# Patient Record
Sex: Male | Born: 1970 | Race: Black or African American | Hispanic: No | Marital: Married | Smoking: Never smoker
Health system: Southern US, Community
[De-identification: ages and names within clinical notes are randomized; demographics above are authoritative.]

## PROBLEM LIST (undated history)

## (undated) ENCOUNTER — Emergency Department (HOSPITAL_COMMUNITY): Admission: EM | Source: Home / Self Care

## (undated) DIAGNOSIS — K219 Gastro-esophageal reflux disease without esophagitis: Secondary | ICD-10-CM

## (undated) DIAGNOSIS — G4730 Sleep apnea, unspecified: Secondary | ICD-10-CM

## (undated) DIAGNOSIS — R011 Cardiac murmur, unspecified: Secondary | ICD-10-CM

## (undated) HISTORY — PX: OTHER SURGICAL HISTORY: SHX169

---

## 2002-01-03 ENCOUNTER — Encounter: Payer: Self-pay | Admitting: Emergency Medicine

## 2002-01-03 ENCOUNTER — Emergency Department (HOSPITAL_COMMUNITY): Admission: EM | Admit: 2002-01-03 | Discharge: 2002-01-04 | Payer: Self-pay | Admitting: Emergency Medicine

## 2011-05-11 ENCOUNTER — Emergency Department (HOSPITAL_COMMUNITY)
Admission: EM | Admit: 2011-05-11 | Discharge: 2011-05-11 | Disposition: A | Discharge status: Home / Self Care | Payer: Federal, State, Local not specified - PPO | Source: Home / Self Care | Attending: Emergency Medicine | Admitting: Emergency Medicine

## 2011-05-11 ENCOUNTER — Encounter: Payer: Self-pay | Admitting: Emergency Medicine

## 2011-05-11 DIAGNOSIS — R6889 Other general symptoms and signs: Secondary | ICD-10-CM

## 2011-05-11 MED ORDER — OSELTAMIVIR PHOSPHATE 75 MG PO CAPS: 75.0000 mg | ORAL_CAPSULE | Freq: Two times a day (BID) | ORAL | Status: AC

## 2011-05-11 MED ORDER — ACETAMINOPHEN 325 MG PO TABS
650.0000 mg | ORAL_TABLET | Freq: Once | ORAL | Status: AC
  Administered 2011-05-11: 650 mg via ORAL

## 2011-05-11 MED ORDER — ACETAMINOPHEN 325 MG PO TABS
ORAL_TABLET | ORAL | Status: AC
  Filled 2011-05-11: qty 2

## 2011-05-11 MED ORDER — HYDROCOD POLST-CHLORPHEN POLST 10-8 MG/5ML PO LQCR
5.0000 mL | Freq: Two times a day (BID) | ORAL | Status: DC | PRN
Start: 2011-05-11 — End: 2016-11-11

## 2011-05-11 NOTE — ED Notes (Signed)
Cough, congestion.  Onset 2 days WUJ:WJXB sweats at night , frequent urination, change in urinary flow, generalized soreness, slight headache, fever

## 2011-05-11 NOTE — ED Provider Notes (Signed)
History     CSN: [id number] Arrival date & time: 05/11/2011  6:15 PM   First MD Initiated Contact with Patient 05/11/11 1712      Chief Complaint  Patient presents with  . URI    (Consider location/radiation/quality/duration/timing/severity/associated sxs/prior treatment) HPI Comments: Stephen Jensen has had a three-day history of a cough productive of small amounts of clear sputum, chest pain when he coughs, sweats, aches, fever, headache, nasal congestion, rhinorrhea, sneezing, sore throat, and muscle cramps. He has had no definite exposures. He has not had a flu vaccine this year.  Patient is a 40 y.o. male presenting with URI.  URI The primary symptoms include fever, fatigue, headaches, sore throat, cough and myalgias. Primary symptoms do not include ear pain, wheezing, abdominal pain, nausea, vomiting or rash.  The headache is not associated with eye pain or neck stiffness.  Symptoms associated with the illness include chills, congestion and rhinorrhea.    History reviewed. No pertinent past medical history.  History reviewed. No pertinent past surgical history.  History reviewed. No pertinent family history.  History  Substance Use Topics  . Smoking status: Never Smoker   . Smokeless tobacco: Not on file  . Alcohol Use: No      Review of Systems  Constitutional: Positive for fever, chills, diaphoresis and fatigue.  HENT: Positive for congestion, sore throat, rhinorrhea and sneezing. Negative for ear pain, neck stiffness, voice change and postnasal drip.   Eyes: Negative for pain, discharge and redness.  Respiratory: Positive for cough. Negative for chest tightness, shortness of breath and wheezing.   Gastrointestinal: Negative for nausea, vomiting, abdominal pain and diarrhea.  Musculoskeletal: Positive for myalgias.  Skin: Negative for rash.  Neurological: Positive for headaches.    Allergies  Review of patient's allergies indicates no known allergies.  Home  Medications   Current Outpatient Rx  Name Route Sig Dispense Refill  . PSEUDOEPH-CPM-DM-APAP 30-2-15-325 MG PO TABS Oral Take by mouth.      [name] HYDROCOD POLST-CHLORPHEN POLST 10-8 MG/5ML PO LQCR Oral Take 5 mLs by mouth every 12 (twelve) hours as needed. 140 mL 0  . OSELTAMIVIR PHOSPHATE 75 MG PO CAPS Oral Take 1 capsule (75 mg total) by mouth every 12 (twelve) hours. 10 capsule 0    BP 138/66  Pulse 98  Temp(Src) 100.8 F (38.2 C) (Oral)  Resp 16  SpO2 100%  Physical Exam  Nursing note and vitals reviewed. Constitutional: He appears well-developed and well-nourished. No distress.  HENT:  Head: Normocephalic and atraumatic.  Right Ear: External ear normal.  Left Ear: External ear normal.  Nose: Nose normal.  Mouth/Throat: Oropharynx is clear and moist. No oropharyngeal exudate.       Pharynx was erythematous without exudate or ulcerations.  Eyes: Conjunctivae and EOM are normal. Pupils are equal, round, and reactive to light. Right eye exhibits no discharge. Left eye exhibits no discharge.  Neck: Normal range of motion. Neck supple.  Cardiovascular: Normal rate, regular rhythm and normal heart sounds.   Pulmonary/Chest: Effort normal and breath sounds normal. No stridor. No respiratory distress. He has no wheezes. He has no rales. He exhibits no tenderness.  Lymphadenopathy:    He has no cervical adenopathy.  Skin: Skin is warm and dry. No rash noted. He is not diaphoretic.    ED Course  Procedures (including critical care time)  Labs Reviewed - No data to display No results found.   1. Influenza-like illness       MDM  He  has an influenza-like illness. Will treat with Tamiflu and Tussionex.        Stephen Lias, MD 05/11/11 [phone]

## 2014-04-30 ENCOUNTER — Ambulatory Visit (HOSPITAL_COMMUNITY)
Admission: RE | Admit: 2014-04-30 | Discharge: 2014-04-30 | Disposition: A | Discharge status: Home / Self Care | Payer: Federal, State, Local not specified - PPO | Attending: Psychiatry | Admitting: Psychiatry

## 2014-05-01 ENCOUNTER — Encounter (HOSPITAL_COMMUNITY): Payer: Self-pay | Admitting: *Deleted

## 2014-05-01 NOTE — BH Assessment (Signed)
Tele Assessment Note   Stephen Jensen is a 43 y.o. male who [facility], accompanied by his father.  Pt states that he wrote a letter today because he wanted to talk to someone about his issues. Pt states this was only way he could express himself, he says talked with his guidance counselor at school and she was concerned because she had not seen him upset and crying.  Pt.'s counselor felt like pt was endorsing thoughts of self harm.  Pt adamantly denies wanting to harm himself and and father/son contracted for safety, d/c'd with referrals. Discussed disposition with spencer simon, pa , agreed with discharge plan.    Axis I: Depressive Disorder NOS Axis II: Deferred Axis III:  Past Medical History  Diagnosis Date  . Depression    Axis IV: educational problems, other psychosocial or environmental problems, problems related to social environment and problems with primary support group Axis V: 51-60 moderate symptoms  Past Medical History:  Past Medical History  Diagnosis Date  . Depression     No past surgical history on file.  Family History: No family history on file.  Social History:  reports that he has never smoked. He does not have any smokeless tobacco history on file. He reports that he does not drink alcohol or use illicit drugs.  Additional Social History:  Alcohol / Drug Use Pain Medications: None  Prescriptions: None  Over the Counter: None  History of alcohol / drug use?: No history of alcohol / drug abuse Longest period of sobriety (when/how long): Nione   CIWA:   COWS:    PATIENT STRENGTHS: (choose at least two) Ability for insight Communication skills Supportive family/friends  Allergies: No Known Allergies  Home Medications:  (Not in a hospital admission)  OB/GYN Status:  No LMP for male patient.  General Assessment Data Location of Assessment: [facility] Assessment Services Is this a Tele or Face-to-Face Assessment?: Face-to-Face Is this  an Initial Assessment or a Re-assessment for this encounter?: Initial Assessment Living Arrangements: Parent, Other relatives (Lives with parents and younger brother ) Can pt return to current living arrangement?: Yes Admission Status: Voluntary Is patient capable of signing voluntary admission?: No (Pt is a minor ) Transfer from: Home Referral Source: Self/Family/Friend  Medical Screening Exam [facility] Walk-in ONLY) Medical Exam completed: No Reason for MSE not completed: Patient Refused  [facility] Living Arrangements: Parent, Other relatives (Lives with parents and younger brother ) Name of Psychiatrist: None  Name of Therapist: None   Education Status Is patient currently in school?: Yes Current Grade: 12th  Highest grade of school patient has completed: 11th  Name of school: [employer]  Contact person: None   Risk to self with the past 6 months Suicidal Ideation: No Suicidal Intent: No Is patient at risk for suicide?: No Suicidal Plan?: No Access to Means: No What has been your use of drugs/alcohol within the last 12 months?: Pt denies  Previous Attempts/Gestures: No How many times?: 0 Other Self Harm Risks: None  Triggers for Past Attempts: None known Intentional Self Injurious Behavior: None Family Suicide History: No Recent stressful life event(s): Other (Comment), Conflict (Comment) (Issues at home; poor grades at school ) Persecutory voices/beliefs?: No Depression: Yes Depression Symptoms: Guilt, Isolating, Tearfulness, Loss of interest in usual pleasures, Feeling worthless/self pity, Feeling angry/irritable Substance abuse history and/or treatment for substance abuse?: No Suicide prevention information given to non-admitted patients: Not applicable  Risk to Others  within the past 6 months Homicidal Ideation: No Thoughts of Harm to Others: No Current Homicidal Intent: No Current Homicidal Plan: No Access to Homicidal Means:  No Identified Victim: None  History of harm to others?: No Assessment of Violence: None Noted Violent Behavior Description: None  Does patient have access to weapons?: No Criminal Charges Pending?: No Does patient have a court date: No  Psychosis Hallucinations: None noted Delusions: None noted  Mental Status Report Appear/Hygiene: Meticulous Eye Contact: Fair Motor Activity: Unremarkable Speech: Logical/coherent, Soft Level of Consciousness: Alert Mood: Depressed, Sad Affect: Depressed, Flat, Sad Anxiety Level: None Thought Processes: Coherent, Relevant Judgement: Impaired Orientation: Person, Place, Time, Situation Obsessive Compulsive Thoughts/Behaviors: None  Cognitive Functioning Concentration: Decreased Memory: Recent Intact, Remote Intact IQ: Average Insight: Fair Impulse Control: Good Appetite: Good Weight Loss: 0 Weight Gain: 0 Sleep: No Change Total Hours of Sleep: 6 Vegetative Symptoms: None  ADLScreening [facility] Assessment Services) Patient's cognitive ability adequate to safely complete daily activities?: Yes Patient able to express need for assistance with ADLs?: Yes Independently performs ADLs?: Yes (appropriate for developmental age)  Prior Inpatient Therapy Prior Inpatient Therapy: No Prior Therapy Dates: None  Prior Therapy Facilty/Provider(s): None  Reason for Treatment: None   Prior Outpatient Therapy Prior Outpatient Therapy: No Prior Therapy Dates: None  Prior Therapy Facilty/Provider(s): None  Reason for Treatment: None   ADL Screening (condition at time of admission) Patient's cognitive ability adequate to safely complete daily activities?: Yes Is the patient deaf or have difficulty hearing?: No Does the patient have difficulty seeing, even when wearing glasses/contacts?: No Does the patient have difficulty concentrating, remembering, or making decisions?: Yes Patient able to express need for assistance with ADLs?: Yes Does the  patient have difficulty dressing or bathing?: No Independently performs ADLs?: Yes (appropriate for developmental age) Does the patient have difficulty walking or climbing stairs?: No Weakness of Legs: None Weakness of Arms/Hands: None  Home Assistive Devices/Equipment Home Assistive Devices/Equipment: None  Therapy Consults (therapy consults require a physician order) PT Evaluation Needed: No OT Evalulation Needed: No SLP Evaluation Needed: No Abuse/Neglect Assessment (Assessment to be complete while patient is alone) Physical Abuse: Denies Verbal Abuse: Denies Sexual Abuse: Denies Exploitation of patient/patient's resources: Denies Self-Neglect: Denies Values / Beliefs Cultural Requests During Hospitalization: None Spiritual Requests During Hospitalization: None Consults Spiritual Care Consult Needed: No Social Work Consult Needed: No Merchant navy officerAdvance Directives (For Healthcare) Does patient have an advance directive?: No Would patient like information on creating an advanced directive?: No - patient declined information Nutrition Screen- [facility] Adult/[facility] Patient's home diet: Regular  Additional Information 1:1 In Past 12 Months?: No CIRT Risk: No Elopement Risk: No Does patient have medical clearance?: No  Child/Adolescent Assessment Running Away Risk: Denies Bed-Wetting: Denies Destruction of Property: Denies Cruelty to Animals: Denies Stealing: Denies Rebellious/Defies Authority: Denies Dispensing opticianatanic Involvement: Denies Archivistire Setting: Denies Problems at [employer]: Admits Problems at [employer] as Evidenced By: Failing classes  Gang Involvement: Denies  Disposition:  Disposition Initial Assessment Completed for this Encounter: Yes Disposition of Patient: Outpatient treatment, Referred to [facility](Spencer Simon, PA recommend outpt svcs with referrals ) Type of outpatient treatment: Child / Adolescent Patient referred to: Outpatient clinic referral [facility](Spencer Simon, [place] recommend outpt svcs  )  Murrell ReddenSimmons, Finleigh Cheong C 05/01/2014 12:37 AM

## 2016-11-11 ENCOUNTER — Emergency Department (HOSPITAL_COMMUNITY)
Admission: EM | Admit: 2016-11-11 | Discharge: 2016-11-11 | Disposition: A | Discharge status: Home / Self Care | Payer: Federal, State, Local not specified - PPO | Attending: Emergency Medicine | Admitting: Emergency Medicine

## 2016-11-11 ENCOUNTER — Emergency Department (HOSPITAL_COMMUNITY): Payer: Federal, State, Local not specified - PPO

## 2016-11-11 ENCOUNTER — Encounter (HOSPITAL_COMMUNITY): Payer: Self-pay

## 2016-11-11 DIAGNOSIS — Y929 Unspecified place or not applicable: Secondary | ICD-10-CM | POA: Diagnosis not present

## 2016-11-11 DIAGNOSIS — Z79899 Other long term (current) drug therapy: Secondary | ICD-10-CM | POA: Diagnosis not present

## 2016-11-11 DIAGNOSIS — X58XXXA Exposure to other specified factors, initial encounter: Secondary | ICD-10-CM | POA: Insufficient documentation

## 2016-11-11 DIAGNOSIS — Y999 Unspecified external cause status: Secondary | ICD-10-CM | POA: Diagnosis not present

## 2016-11-11 DIAGNOSIS — Y9367 Activity, basketball: Secondary | ICD-10-CM | POA: Insufficient documentation

## 2016-11-11 DIAGNOSIS — M25562 Pain in left knee: Secondary | ICD-10-CM | POA: Diagnosis not present

## 2016-11-11 DIAGNOSIS — S76112A Strain of left quadriceps muscle, fascia and tendon, initial encounter: Secondary | ICD-10-CM

## 2016-11-11 DIAGNOSIS — S8992XA Unspecified injury of left lower leg, initial encounter: Secondary | ICD-10-CM | POA: Diagnosis not present

## 2016-11-11 DIAGNOSIS — S8991XA Unspecified injury of right lower leg, initial encounter: Secondary | ICD-10-CM | POA: Diagnosis present

## 2016-11-11 MED ORDER — HYDROCODONE-ACETAMINOPHEN 5-325 MG PO TABS: 1.0000 | ORAL_TABLET | Freq: Four times a day (QID) | ORAL | 0 refills | Status: AC | PRN

## 2016-11-11 MED ORDER — IBUPROFEN 400 MG PO TABS: 400.0000 mg | ORAL_TABLET | Freq: Three times a day (TID) | ORAL | 0 refills | Status: AC

## 2016-11-11 NOTE — ED Provider Notes (Addendum)
[facility]-EMERGENCY DEPT Provider Note   CSN: [id number] Arrival date & time: 11/11/16  2057     History   Chief Complaint Chief Complaint  Patient presents with  . Knee Injury    HPI Stephen Jensen is a 46 y.o. male.  HPI  Patient presents with acute onset left knee pain, inability to extend the leg. Patient was playing basketball, jumped, felt sudden onset of pain. He did not fall, has no other complaints, but since the event has been unable to extend the leg. The pain is sore, severe, focally about the knee, distal anterior thigh.  Past Medical History:  Diagnosis Date  . Depression     There are no active problems to display for this patient.   History reviewed. No pertinent surgical history.     Home Medications    Prior to Admission medications   Medication Sig Start Date End Date Taking? Authorizing Provider  HYDROcodone-acetaminophen (NORCO/VICODIN) 5-325 MG tablet Take 1 tablet by mouth every 6 (six) hours as needed for severe pain. 11/11/16   Gerhard MunchLockwood, Davelle Anselmi, MD  ibuprofen (ADVIL,MOTRIN) 400 MG tablet Take 1 tablet (400 mg total) by mouth 3 (three) times daily. Take one tablet three times daily for three days 11/11/16 11/14/16  Gerhard MunchLockwood, Lowen Barringer, MD    Family History History reviewed. No pertinent family history.  Social History Social History  Substance Use Topics  . Smoking status: Never Smoker  . Smokeless tobacco: Not on file  . Alcohol use No     Allergies   Patient has no known allergies.   Review of Systems Review of Systems  Constitutional: Negative.   Cardiovascular: Negative for palpitations.  Gastrointestinal: Negative for nausea.  Musculoskeletal:       Negative aside from HPI  Skin:       Negative aside from HPI  Allergic/Immunologic: Negative for immunocompromised state.  Neurological: Negative for weakness.  Hematological: Negative.      Physical Exam Updated Vital Signs BP (!) 152/90 (BP Location: Right Arm)    Pulse (!) 111   Temp 98.3 F (36.8 C)   Resp 18   Ht 5\' 11"  (1.803 m)   SpO2 99%   Physical Exam  Constitutional: He is oriented to person, place, and time. He appears well-developed. No distress.  HENT:  Head: Normocephalic and atraumatic.  Eyes: Conjunctivae and EOM are normal.  Cardiovascular: Normal rate, regular rhythm and intact distal pulses.   Pulmonary/Chest: Effort normal. No stridor. No respiratory distress.  Abdominal: He exhibits no distension.  Musculoskeletal: He exhibits no edema.       Left hip: Normal.       Right knee: Normal.       Left knee: He exhibits decreased range of motion, swelling and deformity.       Right ankle: Normal.       Left ankle: Normal.       Legs: Neurological: He is alert and oriented to person, place, and time.  Skin: Skin is warm and dry.  Psychiatric: He has a normal mood and affect.  Nursing note and vitals reviewed.    ED Treatments / Results   Radiology Dg Knee Complete 4 Views Left  Result Date: 11/11/2016 CLINICAL DATA:  Left knee pain after injury. EXAM: LEFT KNEE - COMPLETE 4+ VIEW COMPARISON:  None. FINDINGS: There is patellar Oda KiltsAlta. Prepatellar soft tissue edema anteriorly with indistinctness of the patellar tendon contours. Small joint effusion. No acute fracture. Tibiofemoral alignment is maintained. IMPRESSION: Patellar Alta with  prepatellar soft tissue edema and indistinct patellar tendon contour suspicious for patellar tendon rupture/ injury. No acute fracture. Electronically Signed   By: Rubye Oaks M.D.   On: 11/11/2016 21:36    Procedures Procedures (including critical care time)  After my initial evaluation the patient was placed in a knee immobilization device by orthopedic technician. This was well tolerated. Subsequently I discussed patient's case with our orthopedist on call, who will see the patient in the office tomorrow.   Initial Impression / Assessment and Plan / ED Course  I have reviewed the  triage vital signs and the nursing notes.  Pertinent labs & imaging results that were available during my care of the patient were reviewed by me and considered in my medical decision making (see chart for details).    Final Clinical Impressions(s) / ED Diagnoses   Final diagnoses:  Rupture of left quadriceps tendon, initial encounter    New Prescriptions New Prescriptions   HYDROCODONE-ACETAMINOPHEN (NORCO/VICODIN) 5-325 MG TABLET    Take 1 tablet by mouth every 6 (six) hours as needed for severe pain.   IBUPROFEN (ADVIL,MOTRIN) 400 MG TABLET    Take 1 tablet (400 mg total) by mouth 3 (three) times daily. Take one tablet three times daily for three days     Gerhard Munch, MD 11/11/16 2247    Gerhard Munch, MD 11/19/16 [id number]

## 2016-11-11 NOTE — ED Triage Notes (Signed)
Pt dislocated L patella today while playing basketball. No hx. A&Ox4. Patella feels like it is superior to the normal position.

## 2016-11-11 NOTE — Discharge Instructions (Signed)
As discussed, with your quadriceps tendon disruption and is important that you follow-up with our orthopedic physician tomorrow. Please keep your leg in the immobilization device, and monitor your condition carefully. If you develop new, or concerning changes, return here.

## 2016-11-12 DIAGNOSIS — S86812A Strain of other muscle(s) and tendon(s) at lower leg level, left leg, initial encounter: Secondary | ICD-10-CM | POA: Diagnosis not present

## 2016-11-15 ENCOUNTER — Ambulatory Visit: Payer: Self-pay | Admitting: Specialist

## 2016-11-15 NOTE — Patient Instructions (Signed)
Stephen Jensen  11/15/2016   Your procedure is scheduled on: 11/17/16  [facility]  [place] [place] to 3rd floor to  Short Stay Center at     0800 AM.    Call this number if you have problems the morning of surgery [phone]   Remember: ONLY 1 PERSON MAY GO WITH YOU TO SHORT STAY TO GET  READY MORNING OF YOUR SURGERY.  Do not eat food or drink liquids :After Midnight.     Take these medicines the morning of surgery with A SIP OF WATER: hydrocodone if needed                                You may not have any metal on your body including hair pins and              piercings  Do not wear jewelry, lotions, powders or perfumes, deodorant                      Men may shave face and neck.   Do not bring valuables to the hospital. [place] IS NOT             RESPONSIBLE   FOR VALUABLES.  Contacts, dentures or bridgework may not be worn into surgery.  Leave suitcase in the car. After surgery it may be brought to your room.                Please read over the following fact sheets you were given: _____________________________________________________________________            [facility] Pre[facility] Before surgery, you can play an important role.  Because skin is not sterile, your skin needs to be as free of germs as possible.  You can reduce the number of germs on your skin by washing with CHG (chlorahexidine gluconate) soap before surgery.  CHG is an antiseptic cleaner which kills germs and bonds with the skin to continue killing germs even after washing. Please DO NOT use if you have an allergy to CHG or antibacterial soaps.  If your skin becomes reddened/irritated stop using the CHG and inform your nurse when you arrive at Short Stay. Do not shave (including legs and underarms) for at least 48 hours prior to the first CHG shower.  You may shave your face/neck. Please follow these instructions carefully:  1.   Shower with CHG Soap the night before surgery and the  morning of Surgery.  2.  If you choose to wash your hair, wash your hair first as usual with your  normal  shampoo.  3.  After you shampoo, rinse your hair and body thoroughly to remove the  shampoo.                           4.  Use CHG as you would any other liquid soap.  You can apply chg directly  to the skin and wash                       Gently with a scrungie or clean washcloth.  5.  Apply the CHG Soap to your body ONLY FROM THE NECK DOWN.   Do not use on face/  open                           Wound or open sores. Avoid contact with eyes, ears mouth and genitals (private parts).                       Wash face,  Genitals (private parts) with your normal soap.             6.  Wash thoroughly, paying special attention to the area where your surgery  will be performed.  7.  Thoroughly rinse your body with warm water from the neck down.  8.  DO NOT shower/wash with your normal soap after using and rinsing off  the CHG Soap.                9.  Pat yourself dry with a clean towel.            10.  Wear clean pajamas.            11.  Place clean sheets on your bed the night of your first shower and do not  sleep with pets. Day of Surgery : Do not apply any lotions/deodorants the morning of surgery.  Please wear clean clothes to the hospital/surgery center.  FAILURE TO FOLLOW THESE INSTRUCTIONS MAY RESULT IN THE CANCELLATION OF YOUR SURGERY PATIENT SIGNATURE_________________________________  NURSE SIGNATURE__________________________________  ________________________________________________________________________   Stephen Jensen  An incentive spirometer is a tool that can help keep your lungs clear and active. This tool measures how well you are filling your lungs with each breath. Taking long deep breaths may help reverse or decrease the chance of developing breathing (pulmonary) problems (especially infection) following:  A long  period of time when you are unable to move or be active. BEFORE THE PROCEDURE   If the spirometer includes an indicator to show your best effort, your nurse or respiratory therapist will set it to a desired goal.  If possible, sit up straight or lean slightly forward. Try not to slouch.  Hold the incentive spirometer in an upright position. INSTRUCTIONS FOR USE  1. Sit on the edge of your bed if possible, or sit up as far as you can in bed or on a chair. 2. Hold the incentive spirometer in an upright position. 3. Breathe out normally. 4. Place the mouthpiece in your mouth and seal your lips tightly around it. 5. Breathe in slowly and as deeply as possible, raising the piston or the ball toward the top of the column. 6. Hold your breath for 3-5 seconds or for as long as possible. Allow the piston or ball to fall to the bottom of the column. 7. Remove the mouthpiece from your mouth and breathe out normally. 8. Rest for a few seconds and repeat Steps 1 through 7 at least 10 times every 1-2 hours when you are awake. Take your time and take a few normal breaths between deep breaths. 9. The spirometer may include an indicator to show your best effort. Use the indicator as a goal to work toward during each repetition. 10. After each set of 10 deep breaths, practice coughing to be sure your lungs are clear. If you have an incision (the cut made at the time of surgery), support your incision when coughing by placing a pillow or rolled up towels firmly against it. Once you are able to get out of bed, walk around indoors and  cough well. You may stop using the incentive spirometer when instructed by your caregiver.  RISKS AND COMPLICATIONS  Take your time so you do not get dizzy or light-headed.  If you are in pain, you may need to take or ask for pain medication before doing incentive spirometry. It is harder to take a deep breath if you are having pain. AFTER USE  Rest and breathe slowly and  easily.  It can be helpful to keep track of a log of your progress. Your caregiver can provide you with a simple table to help with this. If you are using the spirometer at home, follow these instructions: [place] IF:   You are having difficultly using the spirometer.  You have trouble using the spirometer as often as instructed.  Your pain medication is not giving enough relief while using the spirometer.  You develop fever of 100.5 F (38.1 C) or higher. SEEK IMMEDIATE MEDICAL CARE IF:   You cough up bloody sputum that had not been present before.  You develop fever of 102 F (38.9 C) or greater.  You develop worsening pain at or near the incision site. MAKE SURE YOU:   Understand these instructions.  Will watch your condition.  Will get help right away if you are not doing well or get worse. Document Released: 09/20/2006 Document Revised: 08/02/2011 Document Reviewed: 11/21/2006 [facility] Patient Information 2014 [place], [place].   ________________________________________________________________________

## 2016-11-16 ENCOUNTER — Encounter (HOSPITAL_COMMUNITY)
Admission: RE | Admit: 2016-11-16 | Discharge: 2016-11-16 | Disposition: A | Discharge status: Home / Self Care | Payer: Federal, State, Local not specified - PPO | Source: Ambulatory Visit | Attending: Specialist | Admitting: Specialist

## 2016-11-16 ENCOUNTER — Encounter (HOSPITAL_COMMUNITY): Payer: Self-pay

## 2016-11-16 DIAGNOSIS — Z6830 Body mass index (BMI) 30.0-30.9, adult: Secondary | ICD-10-CM | POA: Diagnosis not present

## 2016-11-16 DIAGNOSIS — Y9367 Activity, basketball: Secondary | ICD-10-CM | POA: Diagnosis not present

## 2016-11-16 DIAGNOSIS — S76112A Strain of left quadriceps muscle, fascia and tendon, initial encounter: Secondary | ICD-10-CM | POA: Diagnosis not present

## 2016-11-16 HISTORY — DX: Sleep apnea, unspecified: G47.30

## 2016-11-16 HISTORY — DX: Gastro-esophageal reflux disease without esophagitis: K21.9

## 2016-11-16 HISTORY — DX: Cardiac murmur, unspecified: R01.1

## 2016-11-16 LAB — CBC
HEMATOCRIT: 45.1 % (ref 39.0–52.0)
Hemoglobin: 15.8 g/dL (ref 13.0–17.0)
MCH: 30.2 pg (ref 26.0–34.0)
MCHC: 35 g/dL (ref 30.0–36.0)
MCV: 86.1 fL (ref 78.0–100.0)
PLATELETS: 268 10*3/uL (ref 150–400)
RBC: 5.24 MIL/uL (ref 4.22–5.81)
RDW: 13.4 % (ref 11.5–15.5)
WBC: 5.9 10*3/uL (ref 4.0–10.5)

## 2016-11-16 NOTE — H&P (Signed)
Stephen Jensen is an 46 y.o. male.   Chief Complaint: left knee pain HPI:  Patient notes left knee swelling and pain.   This is a very pleasant gentleman, who works for the [employer] in [employer]. He was playing basketball yesterday, was coming down from border jump shot and had pain in his knee, acute swelling and inability to extend. He was seen at the emergency room at [facility]. X-rays demonstrate a high riding patella consistent with a patellar tendon or patellar ligament tear. He reported no problems prior to that. No locking, popping, or giving way.  He was given ibuprofen.  He denies numbness or tingling.   MRI findings show a rupture of the patellar ligament. In addition, there is some stripping of the quadriceps tendon in the anterior portion of it. Cartilage flap of the patella, but no ACL or PCL. Stephen Jensen  Patient has failed conservative treatment.  It is felt at this time they would benefit from surgical intervention.  Risks and benefits of surgery discussed with patient and they wish to proceed. Past Medical History:  Diagnosis Date  . GERD (gastroesophageal reflux disease)    occasional  . Heart murmur    hx of  . Sleep apnea    Does not wear cpap    Past Surgical History:  Procedure Laterality Date  . nickel removed     was swallowed    No family history on file. Social History:  reports that he has never smoked. He has never used smokeless tobacco. He reports that he does not drink alcohol or use drugs.  Allergies: No Known Allergies  No prescriptions prior to admission.    Results for orders placed or performed during the hospital encounter of 11/16/16 (from the past 48 hour(s))  CBC     Status: None   Collection Time: 11/16/16 10:42 AM  Result Value Ref Range   WBC 5.9 4.0 - 10.5 K/uL   RBC 5.24 4.22 - 5.81 MIL/uL   Hemoglobin 15.8 13.0 - 17.0 g/dL   HCT 16.1 09.6 - 04.5 %   MCV 86.1 78.0 - 100.0 fL   MCH 30.2 26.0 - 34.0 pg   MCHC 35.0 30.0 -  36.0 g/dL   RDW 40.9 81.1 - 91.4 %   Platelets 268 150 - 400 K/uL   No results found.  Review of Systems: The patient denies any fever, chills, night sweats, or bleeding tendencies. CNS: No blurred double vision, seizure, headache, paralysis. RESPIRATORY: No shortness of breath, productive cough, or hemoptysis. CARDIOVASCULAR: No chest pain, angina, or orthopnea, GU: No dysuria, hematuria, or discharge. MUSCULOSKELETAL: Pertinent per HPI.  There were no vitals taken for this visit.  GENERAL: Patient is a 46 y.o. male, well-nourished, well-developed, no acute distress. Alert, oriented, cooperative. HENT:  Normocephalic, atraumatic. Pupils round and reactive. EOMs intact. NECK:  Supple, no bruits. CHEST:  Clear to anterior and posterior chest walls. No rhonchi, rales, wheezes. HEART:  Regular, rate and rhythm.  No murmurs.  S1 and S2 noted. ABDOMEN:  Soft, nontender, bowel sounds present. RECTAL/BREAST/GENITALIA:  Not done, not pertinent to present illness. EXTREMITIES:On exam, left knee has a moderate effusion. Unable to perform active straight leg raise. Mildly tender over the medial collateral ligament. Nontender laterally. No evidence of DVT. He is neurovascularly intact.  Ipsilateral hip and ankle exams unremarkable.   Assessment/Plan Traumatic rupture of left patella We discussed patellar ligament repair in hospital. We will initiate that as soon as possible. Get down  some of the swelling prior to that rest, ice, elevation.  Stephen Jensen 11/16/2016, 3:57 PM

## 2016-11-17 ENCOUNTER — Ambulatory Visit (HOSPITAL_COMMUNITY): Payer: Federal, State, Local not specified - PPO | Admitting: Certified Registered Nurse Anesthetist

## 2016-11-17 ENCOUNTER — Encounter (HOSPITAL_COMMUNITY)
Admission: RE | Disposition: A | Discharge status: Home / Self Care | Payer: Self-pay | Source: Ambulatory Visit | Attending: Specialist

## 2016-11-17 ENCOUNTER — Ambulatory Visit (HOSPITAL_COMMUNITY)
Admission: RE | Admit: 2016-11-17 | Discharge: 2016-11-18 | Disposition: A | Discharge status: Home / Self Care | Payer: Federal, State, Local not specified - PPO | Source: Ambulatory Visit | Attending: Specialist | Admitting: Specialist

## 2016-11-17 ENCOUNTER — Encounter (HOSPITAL_COMMUNITY): Payer: Self-pay | Admitting: *Deleted

## 2016-11-17 DIAGNOSIS — Z6830 Body mass index (BMI) 30.0-30.9, adult: Secondary | ICD-10-CM | POA: Insufficient documentation

## 2016-11-17 DIAGNOSIS — S86812A Strain of other muscle(s) and tendon(s) at lower leg level, left leg, initial encounter: Secondary | ICD-10-CM | POA: Diagnosis not present

## 2016-11-17 DIAGNOSIS — Y9367 Activity, basketball: Secondary | ICD-10-CM | POA: Insufficient documentation

## 2016-11-17 DIAGNOSIS — G8918 Other acute postprocedural pain: Secondary | ICD-10-CM | POA: Diagnosis not present

## 2016-11-17 DIAGNOSIS — S76112A Strain of left quadriceps muscle, fascia and tendon, initial encounter: Secondary | ICD-10-CM | POA: Insufficient documentation

## 2016-11-17 DIAGNOSIS — S86819A Strain of other muscle(s) and tendon(s) at lower leg level, unspecified leg, initial encounter: Secondary | ICD-10-CM | POA: Diagnosis present

## 2016-11-17 HISTORY — PX: QUADRICEPS TENDON REPAIR: SHX756

## 2016-11-17 HISTORY — PX: REPAIR OF RUPTURED PATELLA LIGAMENT: SHX6066

## 2016-11-17 SURGERY — REPAIR, TENDON, QUADRICEPS
Anesthesia: General | Site: Knee | Laterality: Left

## 2016-11-17 MED ORDER — POLYETHYLENE GLYCOL 3350 17 G PO PACK: 17.0000 g | PACK | Freq: Every day | ORAL | Status: DC | PRN

## 2016-11-17 MED ORDER — MEPERIDINE HCL 50 MG/ML IJ SOLN: 6.2500 mg | INTRAMUSCULAR | Status: DC | PRN

## 2016-11-17 MED ORDER — FENTANYL CITRATE (PF) 250 MCG/5ML IJ SOLN
INTRAMUSCULAR | Status: AC
  Filled 2016-11-17: qty 5

## 2016-11-17 MED ORDER — SODIUM CHLORIDE 0.9 % IV SOLN
INTRAVENOUS | Status: AC
  Filled 2016-11-17: qty 500000

## 2016-11-17 MED ORDER — ROCURONIUM BROMIDE 50 MG/5ML IV SOSY
PREFILLED_SYRINGE | INTRAVENOUS | Status: AC
  Filled 2016-11-17: qty 5

## 2016-11-17 MED ORDER — MIDAZOLAM HCL 5 MG/5ML IJ SOLN
INTRAMUSCULAR | Status: DC | PRN
  Administered 2016-11-17: 2 mg via INTRAVENOUS

## 2016-11-17 MED ORDER — CHLORHEXIDINE GLUCONATE 4 % EX LIQD: 60.0000 mL | Freq: Once | CUTANEOUS | Status: DC

## 2016-11-17 MED ORDER — BUPIVACAINE-EPINEPHRINE (PF) 0.5% -1:200000 IJ SOLN
INTRAMUSCULAR | Status: AC
  Filled 2016-11-17: qty 30

## 2016-11-17 MED ORDER — KETOROLAC TROMETHAMINE 15 MG/ML IJ SOLN
15.0000 mg | Freq: Four times a day (QID) | INTRAMUSCULAR | Status: AC
  Administered 2016-11-17 – 2016-11-18 (×4): 15 mg via INTRAVENOUS
  Filled 2016-11-17 (×4): qty 1

## 2016-11-17 MED ORDER — ONDANSETRON HCL 4 MG/2ML IJ SOLN
INTRAMUSCULAR | Status: AC
  Filled 2016-11-17: qty 2

## 2016-11-17 MED ORDER — BUPIVACAINE-EPINEPHRINE 0.5% -1:200000 IJ SOLN
INTRAMUSCULAR | Status: DC | PRN
  Administered 2016-11-17: 20 mL

## 2016-11-17 MED ORDER — CEFAZOLIN SODIUM-DEXTROSE 2-4 GM/100ML-% IV SOLN
2.0000 g | INTRAVENOUS | Status: AC
  Administered 2016-11-17: 2 g via INTRAVENOUS
  Filled 2016-11-17: qty 100

## 2016-11-17 MED ORDER — KETOROLAC TROMETHAMINE 30 MG/ML IJ SOLN
INTRAMUSCULAR | Status: DC | PRN
  Administered 2016-11-17: 30 mg via INTRAVENOUS

## 2016-11-17 MED ORDER — MIDAZOLAM HCL 2 MG/2ML IJ SOLN: 0.5000 mg | Freq: Once | INTRAMUSCULAR | Status: DC | PRN

## 2016-11-17 MED ORDER — DOCUSATE SODIUM 100 MG PO CAPS
100.0000 mg | ORAL_CAPSULE | Freq: Two times a day (BID) | ORAL | Status: DC
  Administered 2016-11-17 – 2016-11-18 (×2): 100 mg via ORAL
  Filled 2016-11-17 (×2): qty 1

## 2016-11-17 MED ORDER — FENTANYL CITRATE (PF) 100 MCG/2ML IJ SOLN
INTRAMUSCULAR | Status: AC
  Filled 2016-11-17: qty 2

## 2016-11-17 MED ORDER — METOCLOPRAMIDE HCL 5 MG PO TABS: 5.0000 mg | ORAL_TABLET | Freq: Three times a day (TID) | ORAL | Status: DC | PRN

## 2016-11-17 MED ORDER — MIDAZOLAM HCL 2 MG/2ML IJ SOLN
INTRAMUSCULAR | Status: AC
  Filled 2016-11-17: qty 2

## 2016-11-17 MED ORDER — ACETAMINOPHEN 325 MG PO TABS
650.0000 mg | ORAL_TABLET | Freq: Four times a day (QID) | ORAL | Status: DC | PRN
  Administered 2016-11-17: 17:00:00 650 mg via ORAL
  Filled 2016-11-17: qty 2

## 2016-11-17 MED ORDER — FENTANYL CITRATE (PF) 100 MCG/2ML IJ SOLN
INTRAMUSCULAR | Status: DC | PRN
  Administered 2016-11-17: 100 ug via INTRAVENOUS
  Administered 2016-11-17 (×5): 50 ug via INTRAVENOUS

## 2016-11-17 MED ORDER — ONDANSETRON HCL 4 MG/2ML IJ SOLN: 4.0000 mg | Freq: Four times a day (QID) | INTRAMUSCULAR | Status: DC | PRN

## 2016-11-17 MED ORDER — LACTATED RINGERS IV SOLN
INTRAVENOUS | Status: DC
  Administered 2016-11-17 (×2): via INTRAVENOUS

## 2016-11-17 MED ORDER — KETOROLAC TROMETHAMINE 30 MG/ML IJ SOLN
INTRAMUSCULAR | Status: AC
  Filled 2016-11-17: qty 1

## 2016-11-17 MED ORDER — BUPIVACAINE-EPINEPHRINE (PF) 0.5% -1:200000 IJ SOLN
INTRAMUSCULAR | Status: DC | PRN
  Administered 2016-11-17: 30 mL via PERINEURAL

## 2016-11-17 MED ORDER — ONDANSETRON HCL 4 MG/2ML IJ SOLN
INTRAMUSCULAR | Status: DC | PRN
  Administered 2016-11-17: 4 mg via INTRAVENOUS

## 2016-11-17 MED ORDER — ACETAMINOPHEN 650 MG RE SUPP: 650.0000 mg | Freq: Four times a day (QID) | RECTAL | Status: DC | PRN

## 2016-11-17 MED ORDER — METOCLOPRAMIDE HCL 5 MG/ML IJ SOLN: 5.0000 mg | Freq: Three times a day (TID) | INTRAMUSCULAR | Status: DC | PRN

## 2016-11-17 MED ORDER — DOCUSATE SODIUM 100 MG PO CAPS: 100.0000 mg | ORAL_CAPSULE | Freq: Two times a day (BID) | ORAL | 1 refills | Status: AC | PRN

## 2016-11-17 MED ORDER — DEXAMETHASONE SODIUM PHOSPHATE 10 MG/ML IJ SOLN
INTRAMUSCULAR | Status: AC
  Filled 2016-11-17: qty 1

## 2016-11-17 MED ORDER — LIDOCAINE 2% (20 MG/ML) 5 ML SYRINGE
INTRAMUSCULAR | Status: AC
  Filled 2016-11-17: qty 5

## 2016-11-17 MED ORDER — OXYCODONE-ACETAMINOPHEN 5-325 MG PO TABS: 1.0000 | ORAL_TABLET | ORAL | 0 refills | Status: AC | PRN

## 2016-11-17 MED ORDER — PROPOFOL 10 MG/ML IV BOLUS
INTRAVENOUS | Status: AC
  Filled 2016-11-17: qty 20

## 2016-11-17 MED ORDER — PROPOFOL 10 MG/ML IV BOLUS
INTRAVENOUS | Status: DC | PRN
  Administered 2016-11-17: 300 mg via INTRAVENOUS

## 2016-11-17 MED ORDER — PROMETHAZINE HCL 25 MG/ML IJ SOLN: 6.2500 mg | INTRAMUSCULAR | Status: DC | PRN

## 2016-11-17 MED ORDER — METHOCARBAMOL 500 MG PO TABS
500.0000 mg | ORAL_TABLET | Freq: Four times a day (QID) | ORAL | Status: DC | PRN
  Administered 2016-11-18 (×2): 500 mg via ORAL
  Filled 2016-11-17 (×2): qty 1

## 2016-11-17 MED ORDER — SUCCINYLCHOLINE CHLORIDE 200 MG/10ML IV SOSY
PREFILLED_SYRINGE | INTRAVENOUS | Status: AC
  Filled 2016-11-17: qty 10

## 2016-11-17 MED ORDER — HYDROMORPHONE HCL 1 MG/ML IJ SOLN: 1.0000 mg | INTRAMUSCULAR | Status: DC | PRN

## 2016-11-17 MED ORDER — METHOCARBAMOL 1000 MG/10ML IJ SOLN
500.0000 mg | Freq: Four times a day (QID) | INTRAVENOUS | Status: DC | PRN
  Administered 2016-11-17: 500 mg via INTRAVENOUS
  Filled 2016-11-17: qty 550

## 2016-11-17 MED ORDER — ONDANSETRON HCL 4 MG PO TABS: 4.0000 mg | ORAL_TABLET | Freq: Four times a day (QID) | ORAL | Status: DC | PRN

## 2016-11-17 MED ORDER — HYDROMORPHONE HCL 1 MG/ML IJ SOLN: 0.2500 mg | INTRAMUSCULAR | Status: DC | PRN

## 2016-11-17 MED ORDER — LIDOCAINE 2% (20 MG/ML) 5 ML SYRINGE
INTRAMUSCULAR | Status: DC | PRN
  Administered 2016-11-17: 100 mg via INTRAVENOUS

## 2016-11-17 MED ORDER — IBUPROFEN 800 MG PO TABS: 800.0000 mg | ORAL_TABLET | Freq: Three times a day (TID) | ORAL | 1 refills | Status: AC | PRN

## 2016-11-17 MED ORDER — ASPIRIN EC 325 MG PO TBEC: 325.0000 mg | DELAYED_RELEASE_TABLET | Freq: Two times a day (BID) | ORAL | 1 refills | Status: AC

## 2016-11-17 MED ORDER — ENOXAPARIN SODIUM 40 MG/0.4ML ~~LOC~~ SOLN
40.0000 mg | SUBCUTANEOUS | Status: DC
  Administered 2016-11-18: 08:00:00 40 mg via SUBCUTANEOUS
  Filled 2016-11-17: qty 0.4

## 2016-11-17 MED ORDER — OXYCODONE HCL 5 MG PO TABS
5.0000 mg | ORAL_TABLET | ORAL | Status: DC | PRN
  Administered 2016-11-17 – 2016-11-18 (×6): 5 mg via ORAL
  Filled 2016-11-17 (×6): qty 1

## 2016-11-17 MED ORDER — CEFAZOLIN SODIUM-DEXTROSE 2-4 GM/100ML-% IV SOLN
2.0000 g | Freq: Four times a day (QID) | INTRAVENOUS | Status: AC
  Administered 2016-11-17 (×2): 2 g via INTRAVENOUS
  Filled 2016-11-17 (×2): qty 100

## 2016-11-17 MED ORDER — RISAQUAD PO CAPS
1.0000 | ORAL_CAPSULE | Freq: Every day | ORAL | Status: DC
  Administered 2016-11-18: 08:00:00 1 via ORAL
  Filled 2016-11-17: qty 1

## 2016-11-17 MED ORDER — POLYMYXIN B SULFATE 500000 UNITS IJ SOLR
INTRAMUSCULAR | Status: DC | PRN
  Administered 2016-11-17: 500 mL

## 2016-11-17 SURGICAL SUPPLY — 70 items
BAG ZIPLOCK 12X15 (MISCELLANEOUS) ×3 IMPLANT
BANDAGE ACE 6X5 VEL STRL LF (GAUZE/BANDAGES/DRESSINGS) ×3 IMPLANT
BANDAGE ESMARK 6X9 LF (GAUZE/BANDAGES/DRESSINGS) ×1 IMPLANT
BIT DRILL 2.4X128 (BIT) IMPLANT
BIT DRILL 2.4X128MM (BIT)
BIT DRILL 2.8X128 (BIT) IMPLANT
BIT DRILL 2.8X128MM (BIT)
BNDG COHESIVE 6X5 TAN STRL LF (GAUZE/BANDAGES/DRESSINGS) ×3 IMPLANT
BNDG ELASTIC 6X10 VLCR STRL LF (GAUZE/BANDAGES/DRESSINGS) ×3 IMPLANT
BNDG ESMARK 6X9 LF (GAUZE/BANDAGES/DRESSINGS) ×3
BNDG GAUZE ELAST 4 BULKY (GAUZE/BANDAGES/DRESSINGS) ×3 IMPLANT
CHLORAPREP W/TINT 26ML (MISCELLANEOUS) IMPLANT
CLOSURE WOUND 1/2 X4 (GAUZE/BANDAGES/DRESSINGS) ×1
CLOTH 2% CHLOROHEXIDINE 3PK (PERSONAL CARE ITEMS) ×3 IMPLANT
COVER SURGICAL LIGHT HANDLE (MISCELLANEOUS) ×3 IMPLANT
CUFF TOURN SGL QUICK 34 (TOURNIQUET CUFF) ×2
CUFF TRNQT CYL 34X4X40X1 (TOURNIQUET CUFF) ×1 IMPLANT
DECANTER SPIKE VIAL GLASS SM (MISCELLANEOUS) ×3 IMPLANT
DRAPE ORTHO SPLIT 77X108 STRL (DRAPES) ×4
DRAPE POUCH INSTRU U-SHP 10X18 (DRAPES) ×3 IMPLANT
DRAPE SHEET LG 3/4 BI-LAMINATE (DRAPES) IMPLANT
DRAPE SURG ORHT 6 SPLT 77X108 (DRAPES) ×2 IMPLANT
DRAPE U-SHAPE 47X51 STRL (DRAPES) ×3 IMPLANT
DRSG ADAPTIC 3X8 NADH LF (GAUZE/BANDAGES/DRESSINGS) IMPLANT
DRSG AQUACEL AG ADV 3.5X10 (GAUZE/BANDAGES/DRESSINGS) IMPLANT
DRSG EMULSION OIL 3X16 NADH (GAUZE/BANDAGES/DRESSINGS) ×3 IMPLANT
DRSG PAD ABDOMINAL 8X10 ST (GAUZE/BANDAGES/DRESSINGS) ×3 IMPLANT
DURAPREP 26ML APPLICATOR (WOUND CARE) ×3 IMPLANT
ELECT REM PT RETURN 15FT ADLT (MISCELLANEOUS) ×3 IMPLANT
GAUZE SPONGE 4X4 12PLY STRL (GAUZE/BANDAGES/DRESSINGS) ×3 IMPLANT
GLOVE BIOGEL PI IND STRL 7.0 (GLOVE) ×1 IMPLANT
GLOVE BIOGEL PI IND STRL 8 (GLOVE) ×2 IMPLANT
GLOVE BIOGEL PI INDICATOR 7.0 (GLOVE) ×2
GLOVE BIOGEL PI INDICATOR 8 (GLOVE) ×4
GLOVE SURG SS PI 7.0 STRL IVOR (GLOVE) ×3 IMPLANT
GLOVE SURG SS PI 7.5 STRL IVOR (GLOVE) ×15 IMPLANT
GLOVE SURG SS PI 8.0 STRL IVOR (GLOVE) ×6 IMPLANT
GOWN STRL REUS W/TWL LRG LVL3 (GOWN DISPOSABLE) ×3 IMPLANT
GOWN STRL REUS W/TWL XL LVL3 (GOWN DISPOSABLE) ×9 IMPLANT
IMMOBILIZER KNEE 22 (SOFTGOODS) ×3 IMPLANT
KIT BASIN OR (CUSTOM PROCEDURE TRAY) ×3 IMPLANT
MANIFOLD NEPTUNE II (INSTRUMENTS) ×3 IMPLANT
NEEDLE HYPO 22GX1.5 SAFETY (NEEDLE) ×3 IMPLANT
NEEDLE MA TROC 1/2 CIR (NEEDLE) ×3 IMPLANT
NEEDLE MAYO CATGUT SZ4 (NEEDLE) IMPLANT
PACK ORTHO EXTREMITY (CUSTOM PROCEDURE TRAY) ×3 IMPLANT
PAD ABD 8X10 STRL (GAUZE/BANDAGES/DRESSINGS) ×3 IMPLANT
PADDING CAST COTTON 6X4 STRL (CAST SUPPLIES) ×3 IMPLANT
PASSER SUT SWANSON 36MM LOOP (INSTRUMENTS) ×3 IMPLANT
SPONGE LAP 18X18 X RAY DECT (DISPOSABLE) IMPLANT
SPONGE LAP 4X18 X RAY DECT (DISPOSABLE) IMPLANT
STAPLER VISISTAT (STAPLE) IMPLANT
STRIP CLOSURE SKIN 1/2X4 (GAUZE/BANDAGES/DRESSINGS) ×2 IMPLANT
SUT ETHIBOND 5 LR DA (SUTURE) IMPLANT
SUT ETHIBOND NAB CT1 #1 30IN (SUTURE) IMPLANT
SUT FIBERWIRE #2 38 T-5 BLUE (SUTURE)
SUT MNCRL AB 4-0 PS2 18 (SUTURE) IMPLANT
SUT VIC AB 0 CT1 27 (SUTURE) ×2
SUT VIC AB 0 CT1 27XBRD ANTBC (SUTURE) ×1 IMPLANT
SUT VIC AB 1 CT1 27 (SUTURE) ×4
SUT VIC AB 1 CT1 27XBRD ANTBC (SUTURE) ×2 IMPLANT
SUT VIC AB 2-0 CT1 27 (SUTURE) ×4
SUT VIC AB 2-0 CT1 TAPERPNT 27 (SUTURE) ×2 IMPLANT
SUTURE FIBERWR #2 38 T-5 BLUE (SUTURE) IMPLANT
SYR 20CC LL (SYRINGE) ×3 IMPLANT
SYS INTERNAL BRACE KNEE (Miscellaneous) ×3 IMPLANT
SYSTEM INTERNAL BRACE KNEE (Miscellaneous) ×1 IMPLANT
TOWEL OR 17X26 10 PK STRL BLUE (TOWEL DISPOSABLE) ×6 IMPLANT
TOWEL OR NON WOVEN STRL DISP B (DISPOSABLE) ×3 IMPLANT
WRAP KNEE MAXI GEL POST OP (GAUZE/BANDAGES/DRESSINGS) ×3 IMPLANT

## 2016-11-17 NOTE — Anesthesia Procedure Notes (Signed)
Procedure Name: LMA Insertion Date/Time: 11/17/2016 10:40 AM Performed by: Orest DikesPETERS, Retia Cordle J Pre-anesthesia Checklist: Patient identified, Emergency Drugs available, Suction available and Patient being monitored Patient Re-evaluated:Patient Re-evaluated prior to inductionOxygen Delivery Method: Circle system utilized Preoxygenation: Pre-oxygenation with 100% oxygen Intubation Type: IV induction LMA: LMA inserted LMA Size: 5.0 Number of attempts: 1 Placement Confirmation: positive ETCO2 and breath sounds checked- equal and bilateral Tube secured with: Tape Dental Injury: Teeth and Oropharynx as per pre-operative assessment

## 2016-11-17 NOTE — Progress Notes (Signed)
Assisted Dr. Carswell Jackson with left, ultrasound guided, femoral block. Side rails up, monitors on throughout procedure. See vital signs in flow sheet. Tolerated Procedure well. 

## 2016-11-17 NOTE — Anesthesia Preprocedure Evaluation (Signed)
Anesthesia Evaluation  Patient identified by MRN, date of birth, ID band Patient awake    Reviewed: Allergy & Precautions, NPO status , Patient's Chart, lab work & pertinent test results  History of Anesthesia Complications Negative for: history of anesthetic complications  Airway Mallampati: I  TM Distance: >3 FB Neck ROM: Full    Dental  (+) Dental Advisory Given   Pulmonary neg pulmonary ROS,    breath sounds clear to auscultation       Cardiovascular negative cardio ROS   Rhythm:Regular Rate:Normal     Neuro/Psych negative neurological ROS     GI/Hepatic negative GI ROS, Neg liver ROS, GERD  Controlled,  Endo/Other  Morbid obesity  Renal/GU negative Renal ROS     Musculoskeletal   Abdominal (+) + obese,   Peds  Hematology negative hematology ROS (+)   Anesthesia Other Findings   Reproductive/Obstetrics                             Anesthesia Physical Anesthesia Plan  ASA: II  Anesthesia Plan: General   Post-op Pain Management: GA combined w/ Regional for post-op pain   Induction: Intravenous  PONV Risk Score and Plan: 3 and Ondansetron, Dexamethasone, Propofol and Midazolam  Airway Management Planned: LMA  Additional Equipment:   Intra-op Plan:   Post-operative Plan:   Informed Consent: I have reviewed the patients History and Physical, chart, labs and discussed the procedure including the risks, benefits and alternatives for the proposed anesthesia with the patient or authorized representative who has indicated his/her understanding and acceptance.   Dental advisory given  Plan Discussed with: CRNA and Surgeon  Anesthesia Plan Comments: (Plan routine monitors, GA with femoral nerve block for post op analgesia)        Anesthesia Quick Evaluation

## 2016-11-17 NOTE — Transfer of Care (Signed)
Immediate Anesthesia Transfer of Care Note  Patient: Dutch Graylarence C Gajda  Procedure(s) Performed: Procedure(s) with comments: REPAIR QUADRICEP TENDON (Left) - 90 mins for both procedures REPAIR OF RUPTURED PATELLA LIGAMENT (Left)  Patient Location: PACU  Anesthesia Type:General  Level of Consciousness:  sedated, patient cooperative and responds to stimulation  Airway & Oxygen Therapy:Patient Spontanous Breathing and Patient connected to face mask oxgen  Post-op Assessment:  Report given to PACU RN and Post -op Vital signs reviewed and stable  Post vital signs:  Reviewed and stable  Last Vitals:  Vitals:   11/17/16 1022 11/17/16 1023  BP:    Pulse: 65 63  Resp: (!) 0 (!) 0  Temp:      Complications: No apparent anesthesia complications

## 2016-11-17 NOTE — Anesthesia Procedure Notes (Addendum)
Anesthesia Regional Block: Femoral nerve block   Pre-Anesthetic Checklist: ,, timeout performed, Correct Patient, Correct Site, Correct Laterality, Correct Procedure, Correct Position, site marked, Risks and benefits discussed,  Surgical consent,  Pre-op evaluation,  At surgeon's request and post-op pain management  Laterality: Left and Lower  Prep: chloraprep       Needles:  Injection technique: Single-shot  Needle Type: Stimulator Needle - 40     Needle Length: 4cm  Needle Gauge: 21     Additional Needles:   Procedures:, nerve stimulator,,,,,,,   Nerve Stimulator or Paresthesia:  Response: patella twitch, 0.4 mA, 0.1 ms,   Additional Responses:   Narrative:  Start time: 11/17/2016 10:15 AM End time: 11/17/2016 10:21 AM Injection made incrementally with aspirations every 5 mL.  Performed by: Personally  Anesthesiologist: Jean Jensen, Stephen Jensen  Additional Notes: Pt identified in Holding room.  Monitors applied. Working IV access confirmed. Sterile prep L groin.  #21ga PNS to patella twitch at 0.7940mA threshold.  30cc 0.5% Bupivacaine with 1:200k epi injected incrementally after negative test dose.  Patient asymptomatic, VSS, no heme aspirated, tolerated well.  Sandford Craze Felice Deem, MD

## 2016-11-17 NOTE — Anesthesia Postprocedure Evaluation (Signed)
Anesthesia Post Note  Patient: Stephen GrayClarence C Jensen  Procedure(s) Performed: Procedure(s) (LRB): REPAIR QUADRICEP TENDON (Left) REPAIR OF RUPTURED PATELLA LIGAMENT (Left)     Patient location during evaluation: PACU Anesthesia Type: General and Regional Level of consciousness: awake and alert, patient cooperative and oriented Pain management: pain level controlled Vital Signs Assessment: post-procedure vital signs reviewed and stable Respiratory status: spontaneous breathing, nonlabored ventilation and respiratory function stable Cardiovascular status: blood pressure returned to baseline and stable Postop Assessment: no signs of nausea or vomiting Anesthetic complications: no    Last Vitals:  Vitals:   11/17/16 1414 11/17/16 1518  BP: (!) 145/80 124/75  Pulse: 76 62  Resp: 16 15  Temp: 36.8 C 36.8 C    Last Pain:  Vitals:   11/17/16 1518  TempSrc: Oral  PainSc:                  Kaven Cumbie,E. Tagen Brethauer

## 2016-11-17 NOTE — Brief Op Note (Signed)
11/17/2016  12:16 PM  PATIENT:  Fritzi Mandeslarence C Henson  46 y.o. male  PRE-OPERATIVE DIAGNOSIS:  Left knee quad and patellar ligament tears  POST-OPERATIVE DIAGNOSIS:  Left knee quad and patellar ligament tears  PROCEDURE:  Procedure(s) with comments: REPAIR QUADRICEP TENDON (Left) - 90 mins for both procedures REPAIR OF RUPTURED PATELLA LIGAMENT (Left)  SURGEON:  Surgeon(s) and Role:    Jene Every* Castor Gittleman, MD - Primary  PHYSICIAN ASSISTANT:   ASSISTANTSSu Hilt: Roberts   ANESTHESIA:   general  EBL:  Total I/O In: 1000 [I.V.:1000] Out: -   BLOOD ADMINISTERED:none  DRAINS: none   LOCAL MEDICATIONS USED:  MARCAINE     SPECIMEN:  No Specimen  DISPOSITION OF SPECIMEN:  N/A  COUNTS:  YES  TOURNIQUET:  * Missing tourniquet times found for documented tourniquets in log:  [id number] *  DICTATION: .Other Dictation: Dictation Number [id number]  PLAN OF CARE: Admit for overnight observation  PATIENT DISPOSITION:  PACU - hemodynamically stable.   Delay start of Pharmacological VTE agent (>24hrs) due to surgical blood loss or risk of bleeding: no

## 2016-11-18 ENCOUNTER — Encounter (HOSPITAL_COMMUNITY): Payer: Self-pay | Admitting: Specialist

## 2016-11-18 DIAGNOSIS — Z6830 Body mass index (BMI) 30.0-30.9, adult: Secondary | ICD-10-CM | POA: Diagnosis not present

## 2016-11-18 DIAGNOSIS — S76112A Strain of left quadriceps muscle, fascia and tendon, initial encounter: Secondary | ICD-10-CM | POA: Diagnosis not present

## 2016-11-18 NOTE — Op Note (Deleted)
  The note originally documented on this encounter has been moved the the encounter in which it belongs.  

## 2016-11-18 NOTE — Discharge Summary (Signed)
Patient ID: Stephen Jensen MRN: [id number] DOB/AGE: 09-07-1970 46 y.o.  Admit date: 11/17/2016 Discharge date: 11/18/2016  Admission Diagnoses:  Active Problems:   Tendon rupture, traumatic, patella   Discharge Diagnoses:  Same  Past Medical History:  Diagnosis Date  . GERD (gastroesophageal reflux disease)    occasional  . Heart murmur    hx of  . Sleep apnea    Does not wear cpap    Surgeries: Procedure(s): REPAIR QUADRICEP TENDON REPAIR OF RUPTURED PATELLA LIGAMENT on 11/17/2016   Consultants:   Discharged Condition: Improved  Hospital Course: Stephen Jensen is an 46 y.o. male who was admitted 11/17/2016 for operative treatment of<principal problem not specified>. Patient has severe unremitting pain that affects sleep, daily activities, and work/hobbies. After pre-op clearance the patient was taken to the operating room on 11/17/2016 and underwent  Procedure(s): REPAIR QUADRICEP TENDON REPAIR OF RUPTURED PATELLA LIGAMENT.    Patient was given perioperative antibiotics: Anti-infectives    Start     Dose/Rate Route Frequency Ordered Stop   11/17/16 1600  ceFAZolin (ANCEF) IVPB 2g/100 mL premix     2 g 200 mL/hr over 30 Minutes Intravenous Every 6 hours 11/17/16 1426 11/17/16 2211   11/17/16 1143  polymyxin B 500,000 Units, bacitracin 50,000 Units in sodium chloride 0.9 % 500 mL irrigation  Status:  Discontinued       As needed 11/17/16 1143 11/17/16 1300   11/17/16 0809  ceFAZolin (ANCEF) IVPB 2g/100 mL premix     2 g 200 mL/hr over 30 Minutes Intravenous On call to O.R. 11/17/16 0810 11/17/16 1042       Patient was given sequential compression devices, early ambulation, and chemoprophylaxis to prevent DVT.  Patient benefited maximally from hospital stay and there were no complications.    Recent vital signs: Patient Vitals for the past 24 hrs:  BP Temp Temp src Pulse Resp SpO2 Height Weight  11/18/16 1332 (!) 151/64 98 F (36.7 C) Oral 72 15 92 % - -   11/18/16 0920 (!) 131/51 97.5 F (36.4 C) Oral 60 16 100 % - -  11/18/16 0552 (!) 110/51 97.7 F (36.5 C) Oral (!) 52 17 100 % - -  11/18/16 0042 114/64 97.7 F (36.5 C) Oral (!) 50 16 97 % - -  11/17/16 2031 121/60 97.5 F (36.4 C) Oral 63 16 96 % - -  11/17/16 1721 (!) 153/73 97.6 F (36.4 C) Axillary (!) 55 15 98 % - -  11/17/16 1621 127/80 97.8 F (36.6 C) Oral 62 16 97 % - -  11/17/16 1518 124/75 98.3 F (36.8 C) Oral 62 15 97 % - -  11/17/16 1414 (!) 145/80 98.3 F (36.8 C) Oral 76 16 98 % 5\' 11"  (1.803 m) 219 lb (99.3 kg)  11/17/16 1402 - - - 63 16 99 % - -  11/17/16 1400 136/86 - - 64 17 99 % - -  11/17/16 1345 122/74 - - 65 15 98 % - -     Recent laboratory studies:  Recent Labs  11/16/16 1042  WBC 5.9  HGB 15.8  HCT 45.1  PLT 268     Discharge Medications:   Allergies as of 11/18/2016   No Known Allergies     Medication List    TAKE these medications   aspirin EC 325 MG tablet Take 1 tablet (325 mg total) by mouth 2 (two) times daily.   docusate sodium 100 MG capsule Commonly known as:  COLACE Take  1 capsule (100 mg total) by mouth 2 (two) times daily as needed for mild constipation.   HYDROcodone-acetaminophen 5-325 MG tablet Commonly known as:  NORCO/VICODIN Take 1 tablet by mouth every 6 (six) hours as needed for severe pain.   ibuprofen 800 MG tablet Commonly known as:  ADVIL,MOTRIN Take 1 tablet (800 mg total) by mouth 3 (three) times daily with meals as needed.   oxyCODONE-acetaminophen 5-325 MG tablet Commonly known as:  PERCOCET Take 1-2 tablets by mouth every 4 (four) hours as needed for severe pain.       Diagnostic Studies: Dg Knee Complete 4 Views Left  Result Date: 11/11/2016 CLINICAL DATA:  Left knee pain after injury. EXAM: LEFT KNEE - COMPLETE 4+ VIEW COMPARISON:  None. FINDINGS: There is patellar Oda Kilts. Prepatellar soft tissue edema anteriorly with indistinctness of the patellar tendon contours. Small joint effusion. No acute  fracture. Tibiofemoral alignment is maintained. IMPRESSION: Patellar Alta with prepatellar soft tissue edema and indistinct patellar tendon contour suspicious for patellar tendon rupture/ injury. No acute fracture. Electronically Signed   By: Rubye Oaks M.D.   On: 11/11/2016 21:36    Disposition: 01-Home or Self Care  Discharge Instructions    Call MD / Call 911    Complete by:  As directed    If you experience chest pain or shortness of breath, CALL 911 and be transported to the hospital emergency room.  If you develope a fever above 101 F, pus (white drainage) or increased drainage or redness at the wound, or calf pain, call your surgeon's office.   Change dressing    Complete by:  As directed    Change the dressing daily with sterile 4 x 4 inch gauze dressing.  You may clean the incision with alcohol prior to redressing.   Constipation Prevention    Complete by:  As directed    Drink plenty of fluids.  Prune juice and/or coffee may be helpful.  You may use a stool softener, such as Colace (over the counter) 100 mg twice a day.  Use MiraLax (over the counter) for constipation as needed but this may take several days to work.  Mag Citrate --OR-- Milk of Magnesia may also be used but follow directions on the label.   Diet - low sodium heart healthy    Complete by:  As directed    Discharge instructions    Complete by:  As directed    Keep knee in brace whenever walking or sleeping   Driving restrictions    Complete by:  As directed    No driving for until cleared by Dr Shelle Iron   Increase activity slowly as tolerated    Complete by:  As directed    Elevate left leg on pillows, with support for knee   Patient may shower    Complete by:  As directed    You may shower on the 5th day after surgery.  Once the dressing is removed you may shower without a dressing once there is no drainage.  Do not wash over the wound.  If drainage remains, cover wound with plastic wrap and then shower. re  bandage with gauze and ace wrap   TED hose    Complete by:  As directed    Use stockings (TED hose) for 2 weeks on both leg(s).  You may remove them at night for sleeping.      partial weight bearing left lower extremity Follow up with dr Shelle Iron in two weeks-call for  appointment Signed: Skip MayerBLAIR ROBERTS 11/18/2016, 1:34 PM

## 2016-11-18 NOTE — Progress Notes (Signed)
Subjective: 1 Day Post-Op Procedure(s) (LRB): REPAIR QUADRICEP TENDON (Left) REPAIR OF RUPTURED PATELLA LIGAMENT (Left) Patient reports pain as 4 on 0-10 scale.   Still experiencing numbness in thigh  Objective: Vital signs in last 24 hours: Temp:  [97.5 F (36.4 C)-98.6 F (37 C)] 97.7 F (36.5 C) (06/28 0552) Pulse Rate:  [50-84] 52 (06/28 0552) Resp:  [0-21] 17 (06/28 0552) BP: (110-153)/(51-87) 110/51 (06/28 0552) SpO2:  [96 %-100 %] 100 % (06/28 0552) Weight:  [219 lb (99.3 kg)] 219 lb (99.3 kg) (06/27 1414)  Intake/Output from previous day: 06/27 0701 - 06/28 0700 In: 2670 [P.O.:1270; I.V.:1300; IV Piggyback:100] Out: 1225 [Urine:1200; Blood:25] Intake/Output this shift: No intake/output data recorded.   Recent Labs  11/16/16 1042  HGB 15.8    Recent Labs  11/16/16 1042  WBC 5.9  RBC 5.24  HCT 45.1  PLT 268   No results for input(s): NA, K, CL, CO2, BUN, CREATININE, GLUCOSE, CALCIUM in the last 72 hours. No results for input(s): LABPT, INR in the last 72 hours.  Sensation intact distally Intact pulses distally Dorsiflexion/Plantar flexion intact Compartment soft dressing dry, intact  Assessment/Plan: 1 Day Post-Op Procedure(s) (LRB): REPAIR QUADRICEP TENDON (Left) REPAIR OF RUPTURED PATELLA LIGAMENT (Left) Advance diet Up with therapy D/C IV fluids  Will replace knee immobilizer with TROM brace later today, prior to discharge  BLAIR ROBERTS 11/18/2016, 8:37 AM

## 2016-11-18 NOTE — Care Management Note (Addendum)
Case Management Note  Patient Details  Name: Dutch GrayClarence C Cunnington MRN: [id number] Date of Birth: 17-Sep-1970  Subjective/Objective:       Open mini-repair of the patellar ligament             Action/Plan: Discharge Planning: NCM spoke to pt and states he will stay with his mother for few days. Pt has crutches for home. No NCM needs identified.    Expected Discharge Date:  11/18/16               Expected Discharge Plan:  Home/Self Care  In-House Referral:  NA  Discharge planning Services  CM Consult  Post Acute Care Choice:  NA Choice offered to:  NA  DME Arranged:  N/A DME Agency:  NA  HH Arranged:  NA HH Agency:  NA  Status of Service:  Completed, signed off  If discussed at Long Length of Stay Meetings, dates discussed:    Additional Comments:  Elliot CousinShavis, Heitor Steinhoff Ellen, RN 11/18/2016, 1:40 PM

## 2016-11-18 NOTE — Op Note (Signed)
NAMNewt Minion:  Zayas, Crist            ACCOUNT NO.:  [id number]  MEDICAL RECORD NO.:  [id number]  LOCATION:                               [facility]  PHYSICIAN:  Jene EveryJeffrey Niquita Digioia, M.D.    DATE OF BIRTH:  [birth year]  DATE OF PROCEDURE: DATE OF DISCHARGE:                              OPERATIVE REPORT   PREOPERATIVE DIAGNOSIS:  Patellar ligament rupture, quadriceps tendon tear.  POSTOPERATIVE DIAGNOSIS:  Patellar ligament rupture, quadriceps tendon tear.  PROCEDURE PERFORMED:  Open mini-repair of the patellar ligament utilizing [employer] SwiveLock suture anchors and FiberTape.  ANESTHESIA:  General.  ASSISTANT:  Skip MayerBlair Roberts, PA.  HISTORY:  This is a 46 year old male, ruptured his patellar ligament, quad extension, playing basketball as confirmed by MRI with other ligamentous injury.  He was indicated for repair, there were some anterior fibers off the quadriceps in the superior pole of the patella, were to evaluate the quadriceps tendon repair if required.  We discussed the risks and benefits including bleeding, infection, damage to the neurovascular structures, suboptimal range of motion, recurrent tear, DVT, PE, anesthetic complications, etc.  TECHNIQUE:  With the patient in supine position after induction of adequate general anesthesia, 2 g of Kefzol, the left lower extremity was prepped and draped, and exsanguinated in usual sterile fashion.  Thigh tourniquet was inflated to 250 mmHg.  Midline incision was then made from the superior pole of patella to the tibial tubercle.  Subcutaneous tissue was dissected.  Electrocautery was utilized to achieve hemostasis and noted that after incising the peritenon, was an extensive rupture of the patellar ligament extending into the retinaculum bilaterally.  The joint was exposed, there was seroma that was irrigated and lavaged. Inspected the joint, chondral surfaces were unremarkable as were the menisci and the ACL.  Next, after  copious irrigation, we inverted the inferior pole of the patella.  There was a 2-cm attachment of the patellar ligament to the inferior pole, remaining, the tendon was significantly shredded; however, I fashioned the bony trough just beneath this, retained portion of the patellar ligament with a Beyer rongeur, then separated by 2 cm.  Two pilot holes were drilled and tapped for the SwiveLock suture anchors.  This was after over-drilling and tapping these.  I then used after debriding the ends of the tear, a FiberTape and a running Krackow-type stitch, secured the inferior portion of the patellar ligament.  Advanced these leaflets into the two separate SwiveLocks approximating the length of the tendon and the position of the patella and then inserting it into the SwiveLock. Excellent resistance to pullout was noted.  This delivered it into the bony trough satisfactorily.  Following this, we then oversewed the leading edges of the patellar ligament with the remaining FiberWire rescue suture as well as multiple #1 interrupted figure-of-8 sutures as well as the retinaculum for watertight closure, and then performed copious irrigation, I then inspected the quadriceps, the anterior fibers of the anterior aspect of the patella were violated, but not the integrity of the quadriceps tendon to the superior pole.  This was palpated, I flexed the knee up to 45 degrees with appropriate patellofemoral tracking.  Engagement of the quadriceps and of the patellar ligament repair,  there was no patella alta or baja appreciated. There was good lateral and medial stability.  We then released the tourniquet at an hour.  There was good revascularization of the wound appreciated, copiously irrigated. Electrocautery was utilized to achieve hemostasis.  The wound was then closed with 2-0 subcu and Prolene, Steri-Strips applied.  Placed in a dressing, knee immobilizer, and then extubated without difficulty and  transported to the recovery room in a satisfactory condition.  The patient tolerated the procedure well.  No complications.  Assistant, Skip Mayer, PA, was used throughout the case for closure, exposure and retraction.     Jene Every, M.D.   ______________________________ Jene Every, M.D.    Cordelia Pen  D:  11/17/2016  T:  11/18/2016  Job:  [id number]

## 2016-11-18 NOTE — Evaluation (Signed)
Physical Therapy Evaluation Patient Details Name: Stephen Jensen MRN: [id number] DOB: [birth year] Today's Date: 11/18/2016   History of Present Illness  Pt s/p quad/patellar tendon repair  Clinical Impression  Pt admitted as above and is currently mobilizing at sup to mod I level utilizing crutches.  Pt eager for dc home with parent.    Follow Up Recommendations No PT follow up    Equipment Recommendations  None recommended by PT    Recommendations for Other Services       Precautions / Restrictions Precautions Precautions: Fall Required Braces or Orthoses: Other Brace/Splint Restrictions Weight Bearing Restrictions: Yes LLE Weight Bearing: Partial weight bearing Other Position/Activity Restrictions: wt on heel only and with KI in place      Mobility  Bed Mobility Overal bed mobility: Needs Assistance Bed Mobility: Supine to Sit;Sit to Supine     Supine to sit: Supervision Sit to supine: Supervision   General bed mobility comments: Pt self assisting L LE with R LE  Transfers Overall transfer level: Needs assistance Equipment used: None Transfers: Sit to/from Stand Sit to Stand: Supervision         General transfer comment: min cues for use of UEs to self assist  Ambulation/Gait Ambulation/Gait assistance: Min guard;Supervision Ambulation Distance (Feet): 180 Feet Assistive device: Crutches Gait Pattern/deviations: Step-to pattern;Step-through pattern;Decreased step length - right;Shuffle     General Gait Details: Pt ambulating NWB, cues for L heel contact  Stairs Stairs: Yes Stairs assistance: Min guard;Supervision Stair Management: One rail Left;Step to pattern;Forwards;With crutches Number of Stairs: 10 General stair comments: min cues for foot/crutch placement  Wheelchair Mobility    Modified Rankin (Stroke Patients Only)       Balance Overall balance assessment: No apparent balance deficits (not formally assessed)                                            Pertinent Vitals/Pain Pain Assessment: 0-10 Pain Score: 3  Pain Location: L knee Pain Descriptors / Indicators: Aching;Sore Pain Intervention(s): Limited activity within patient's tolerance;Monitored during session;Premedicated before session    Home Living Family/patient expects to be discharged to:: Private residence Living Arrangements: Alone;Children;Parent Available Help at Discharge: Family Type of Home: House Home Access: Stairs to enter Entrance Stairs-Rails: Doctor, general practice of Steps: 2 Home Layout: Two level;Able to live on main level with bedroom/bathroom Home Equipment: Crutches      Prior Function Level of Independence: Independent         Comments: Pt using crutches for several days prior to surgery     Hand Dominance        Extremity/Trunk Assessment   Upper Extremity Assessment Upper Extremity Assessment: Overall WFL for tasks assessed    Lower Extremity Assessment Lower Extremity Assessment: LLE deficits/detail LLE Deficits / Details: Knee brace in place    Cervical / Trunk Assessment Cervical / Trunk Assessment: Normal  Communication   Communication: No difficulties  Cognition Arousal/Alertness: Awake/alert Behavior During Therapy: WFL for tasks assessed/performed Overall Cognitive Status: Within Functional Limits for tasks assessed                                        General Comments      Exercises General Exercises - Lower Extremity Ankle Circles/Pumps: AROM;Both;15 reps;Supine  Assessment/Plan    PT Assessment Patent does not need any further PT services  PT Problem List         PT Treatment Interventions      PT Goals (Current goals can be found in the Care Plan section)       Frequency     Barriers to discharge        Co-evaluation               AM-PAC PT "6 Clicks" Daily Activity  Outcome Measure Difficulty turning over in bed  (including adjusting bedclothes, sheets and blankets)?: A Little Difficulty moving from lying on back to sitting on the side of the bed? : A Little Difficulty sitting down on and standing up from a chair with arms (e.g., wheelchair, bedside commode, etc,.)?: A Little Help needed moving to and from a bed to chair (including a wheelchair)?: None Help needed walking in hospital room?: None Help needed climbing 3-5 steps with a railing? : A Little 6 Click Score: 20    End of Session Equipment Utilized During Treatment: Gait belt;Left knee immobilizer Activity Tolerance: Patient tolerated treatment well Patient left: in bed;with call bell/phone within reach Nurse Communication: Mobility status PT Visit Diagnosis: Difficulty in walking, not elsewhere classified (R26.2)    Time: [id number] PT Time Calculation (min) (ACUTE ONLY): 22 min   Charges:   PT Evaluation $PT Eval Low Complexity: 1 Procedure     PT G Codes:   PT G-Codes **NOT FOR INPATIENT CLASS** Functional Assessment Tool Used: Clinical judgement Functional Limitation: Mobility: Walking and moving around Mobility: Walking and Moving Around Current Status ([id number]): At least 1 percent but less than 20 percent impaired, limited or restricted Mobility: Walking and Moving Around Goal Status [phone]): At least 1 percent but less than 20 percent impaired, limited or restricted Mobility: Walking and Moving Around Discharge Status [phone](G8980): At least 1 percent but less than 20 percent impaired, limited or restricted    Pg [phone]   Kemiya Batdorf 11/18/2016, 5:17 PM

## 2016-12-14 DIAGNOSIS — M25562 Pain in left knee: Secondary | ICD-10-CM | POA: Diagnosis not present

## 2016-12-21 DIAGNOSIS — M25562 Pain in left knee: Secondary | ICD-10-CM | POA: Diagnosis not present

## 2016-12-23 DIAGNOSIS — M25562 Pain in left knee: Secondary | ICD-10-CM | POA: Diagnosis not present

## 2016-12-28 DIAGNOSIS — M25562 Pain in left knee: Secondary | ICD-10-CM | POA: Diagnosis not present

## 2017-01-04 DIAGNOSIS — M25562 Pain in left knee: Secondary | ICD-10-CM | POA: Diagnosis not present

## 2017-01-06 DIAGNOSIS — M25562 Pain in left knee: Secondary | ICD-10-CM | POA: Diagnosis not present

## 2017-01-10 DIAGNOSIS — M25562 Pain in left knee: Secondary | ICD-10-CM | POA: Diagnosis not present

## 2017-01-18 DIAGNOSIS — M25562 Pain in left knee: Secondary | ICD-10-CM | POA: Diagnosis not present

## 2017-01-25 DIAGNOSIS — M25562 Pain in left knee: Secondary | ICD-10-CM | POA: Diagnosis not present

## 2017-01-27 DIAGNOSIS — M25562 Pain in left knee: Secondary | ICD-10-CM | POA: Diagnosis not present

## 2017-02-03 DIAGNOSIS — M25562 Pain in left knee: Secondary | ICD-10-CM | POA: Diagnosis not present

## 2017-02-08 DIAGNOSIS — M25562 Pain in left knee: Secondary | ICD-10-CM | POA: Diagnosis not present

## 2017-02-15 DIAGNOSIS — M25562 Pain in left knee: Secondary | ICD-10-CM | POA: Diagnosis not present

## 2017-02-24 DIAGNOSIS — M25562 Pain in left knee: Secondary | ICD-10-CM | POA: Diagnosis not present

## 2018-10-10 IMAGING — CR DG KNEE COMPLETE 4+V*L*
4 series · 4 of 4 positions shown · non-contrast
Comparison: None.

CLINICAL DATA: Left knee pain after injury.

EXAM:
LEFT KNEE - COMPLETE 4+ VIEW

[t knee ap left]
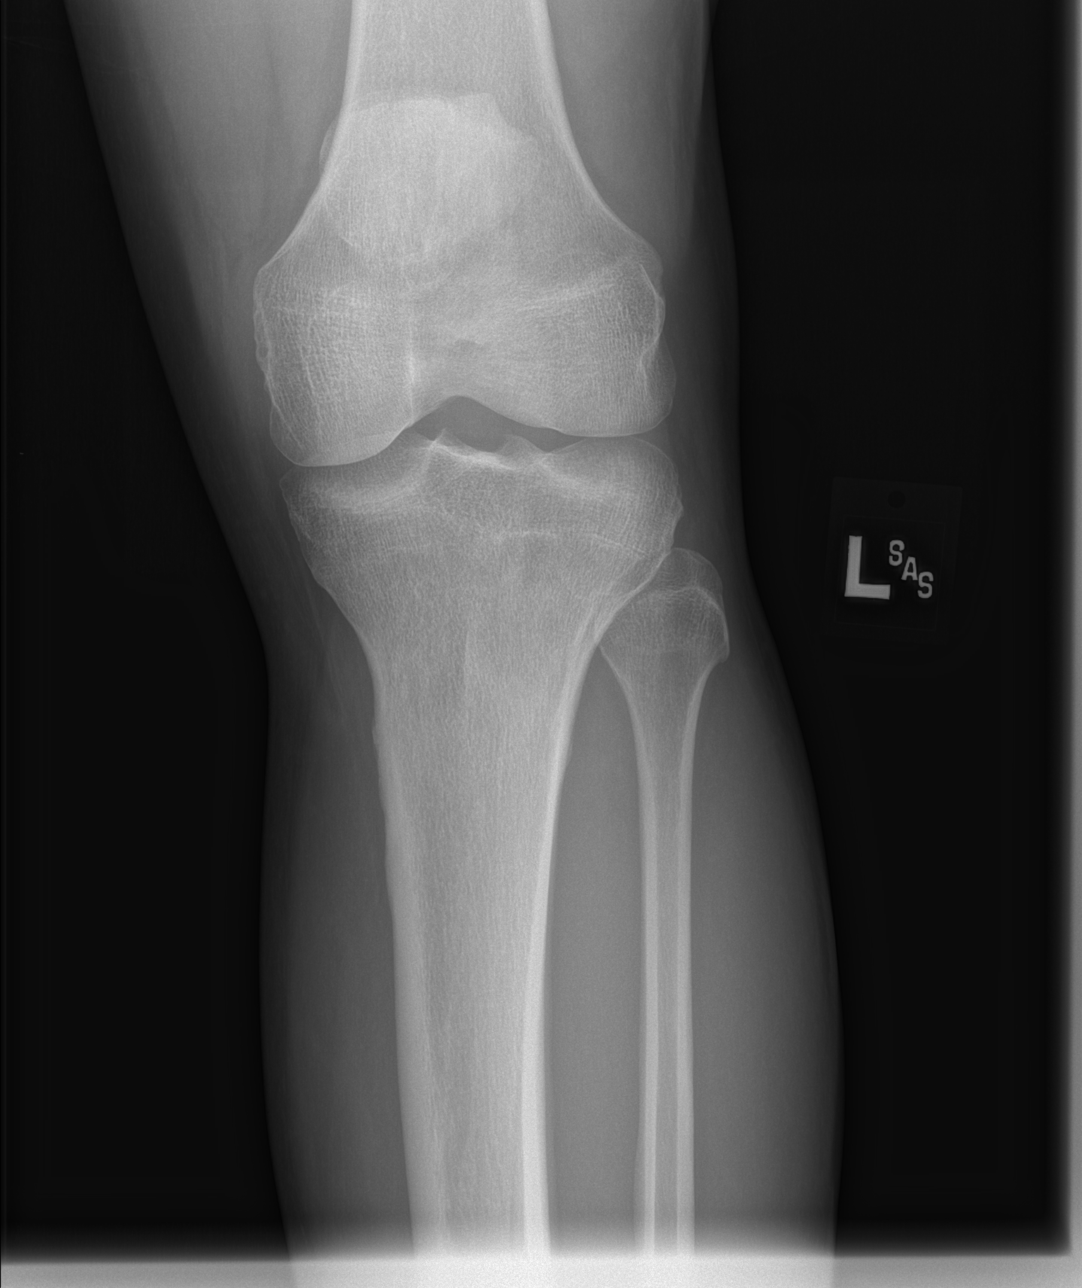

[t knee obl left (1 of 2)]
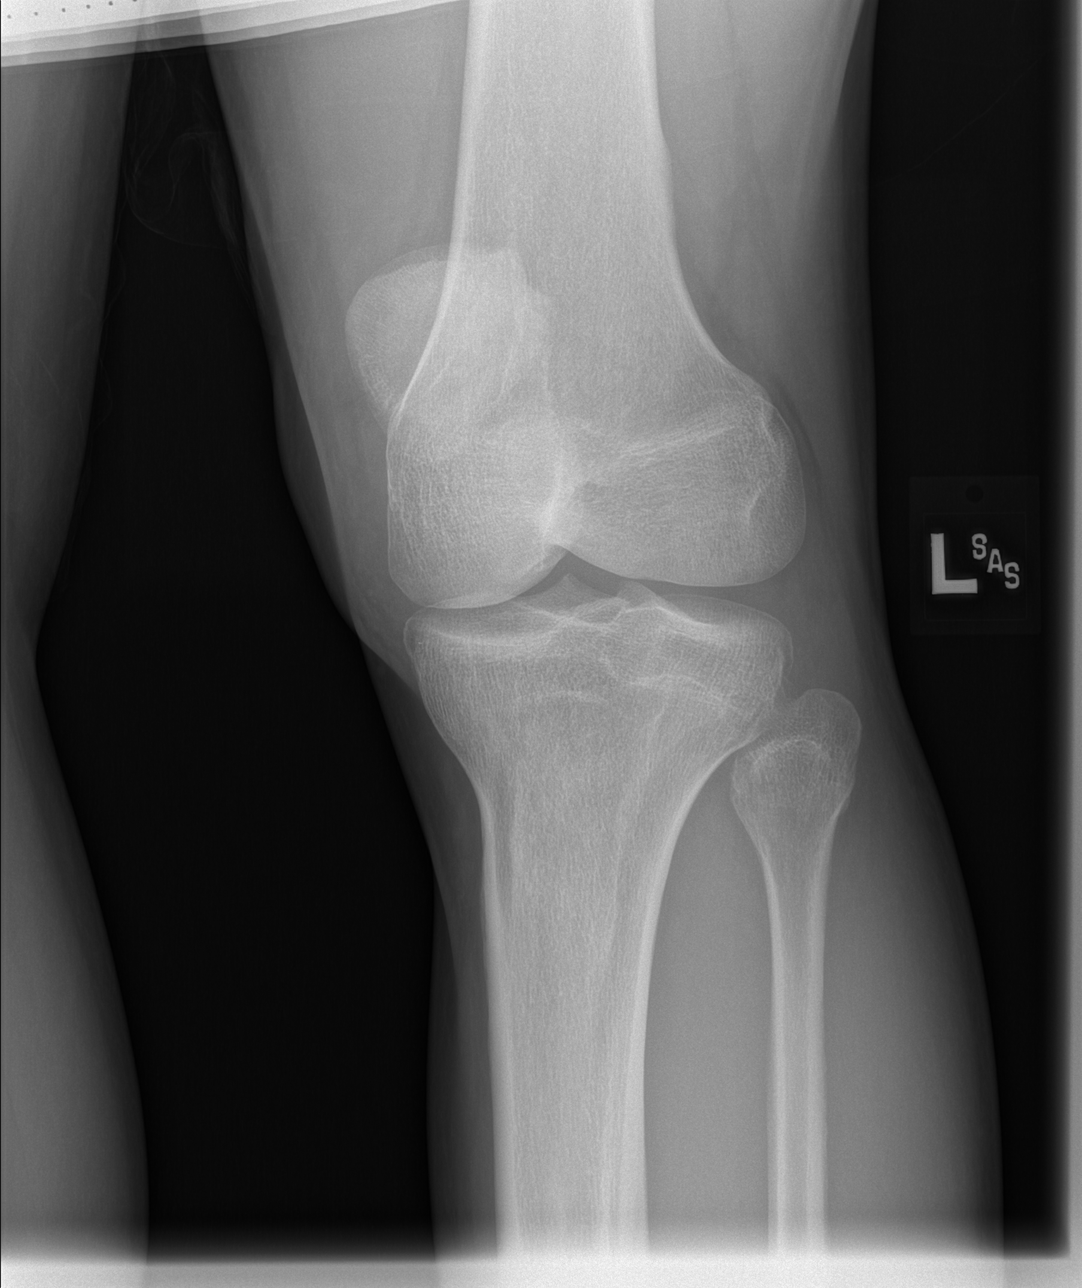

[t knee obl left (2 of 2)]
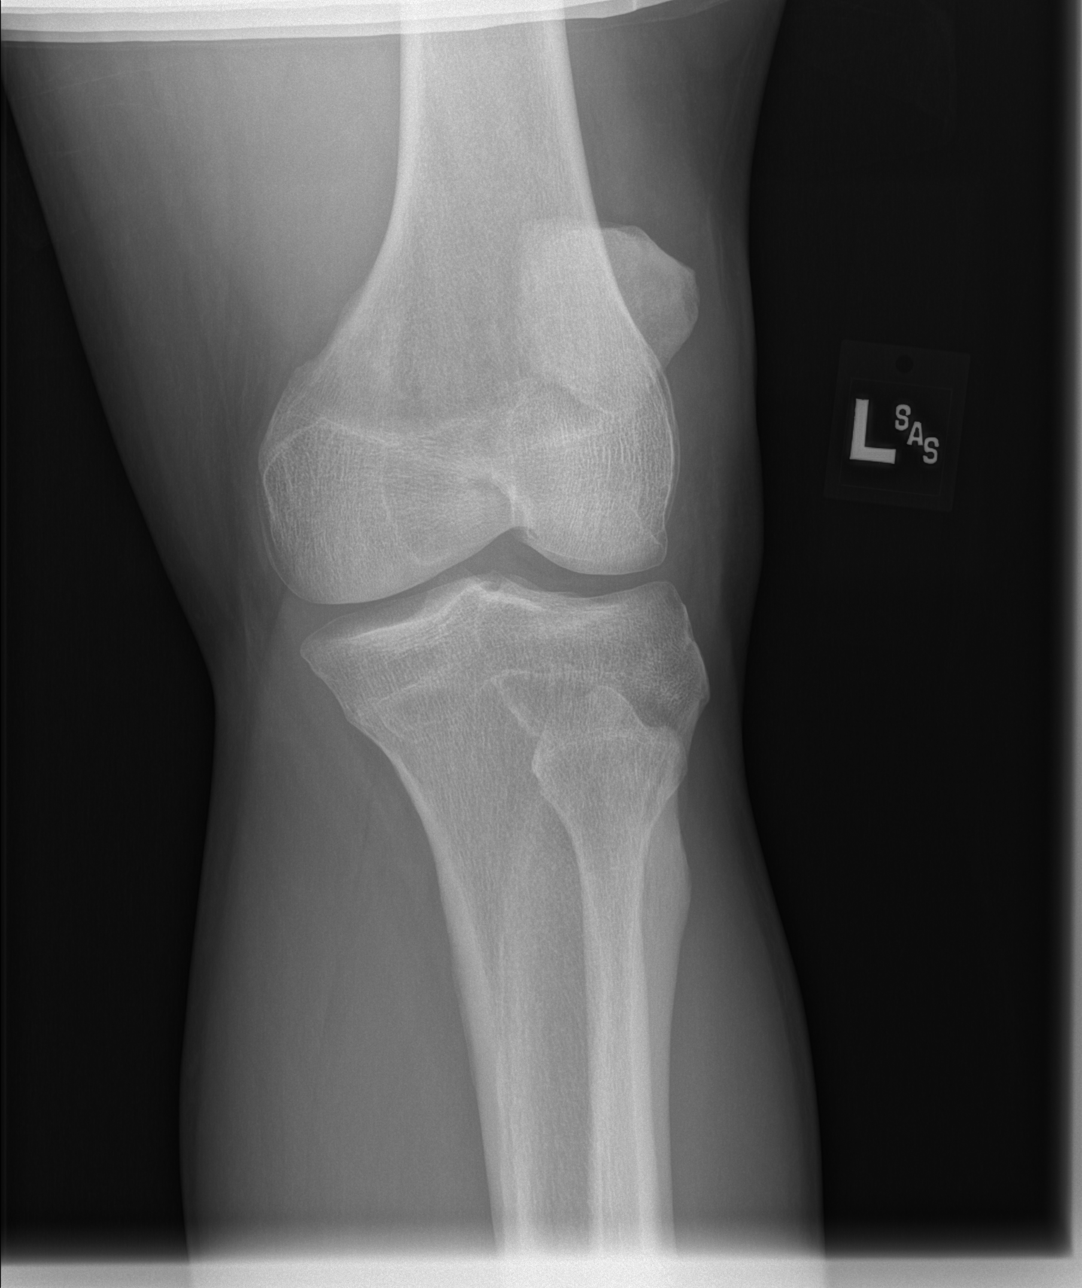

[t knee lat left]
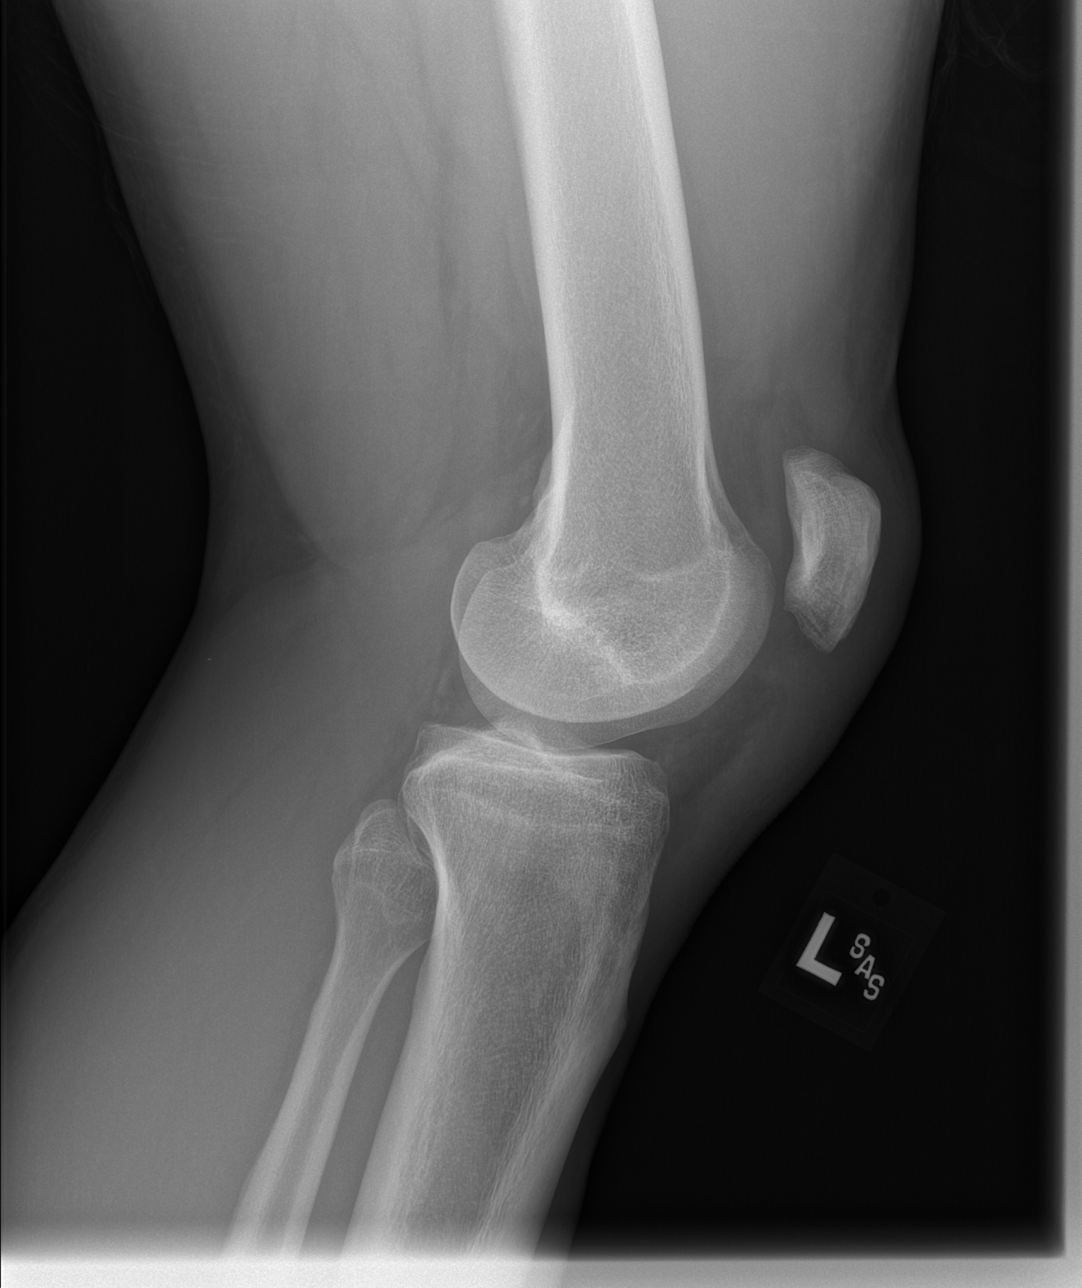

[4 of 4 positions shown; findings below may reference images not displayed]

FINDINGS: There is patellar Alta. Prepatellar soft tissue edema anteriorly
with indistinctness of the patellar tendon contours. Small joint
effusion. No acute fracture. Tibiofemoral alignment is maintained.
IMPRESSION: Patellar Alta with prepatellar soft tissue edema and indistinct
patellar tendon contour suspicious for patellar tendon rupture/
injury.

No acute fracture.

## 2021-04-13 ENCOUNTER — Ambulatory Visit (HOSPITAL_COMMUNITY)
Admission: EM | Admit: 2021-04-13 | Discharge: 2021-04-13 | Disposition: A | Discharge status: Home / Self Care | Payer: Federal, State, Local not specified - PPO | Attending: Urgent Care | Admitting: Urgent Care

## 2021-04-13 ENCOUNTER — Other Ambulatory Visit: Payer: Self-pay

## 2021-04-13 ENCOUNTER — Encounter (HOSPITAL_COMMUNITY): Payer: Self-pay

## 2021-04-13 DIAGNOSIS — R067 Sneezing: Secondary | ICD-10-CM

## 2021-04-13 DIAGNOSIS — J069 Acute upper respiratory infection, unspecified: Secondary | ICD-10-CM | POA: Diagnosis not present

## 2021-04-13 LAB — POC INFLUENZA A AND B ANTIGEN (URGENT CARE ONLY)
INFLUENZA A ANTIGEN, POC: NEGATIVE
INFLUENZA B ANTIGEN, POC: NEGATIVE

## 2021-04-13 MED ORDER — CETIRIZINE HCL 10 MG PO TABS: 10.0000 mg | ORAL_TABLET | Freq: Every day | ORAL | 0 refills | Status: AC

## 2021-04-13 MED ORDER — BENZONATATE 100 MG PO CAPS: 100.0000 mg | ORAL_CAPSULE | Freq: Three times a day (TID) | ORAL | 0 refills | Status: DC | PRN

## 2021-04-13 MED ORDER — PROMETHAZINE-DM 6.25-15 MG/5ML PO SYRP: 5.0000 mL | ORAL_SOLUTION | Freq: Every evening | ORAL | 0 refills | Status: DC | PRN

## 2021-04-13 MED ORDER — PSEUDOEPHEDRINE HCL 60 MG PO TABS: 60.0000 mg | ORAL_TABLET | Freq: Three times a day (TID) | ORAL | 0 refills | Status: AC | PRN

## 2021-04-13 NOTE — ED Triage Notes (Signed)
Pt presents with c/o coughing and sneezing x 5 days. States he took Thera  Flu and felt better. States today he feels worse.

## 2021-04-13 NOTE — Discharge Instructions (Addendum)
We will manage this as a viral illness. For sore throat or cough try using a honey-based tea. Use 3 teaspoons of honey with juice squeezed from half lemon. Place shaved pieces of ginger into 1/2-1 cup of water and warm over stove top. Then mix the ingredients and repeat every 4 hours as needed. Please take ibuprofen 600mg  every 6 hours with food alternating with OR taken together with Tylenol 500mg -650mg  every 6 hours for throat pain, fevers, aches and pains. Hydrate very well with at least 2 liters of water. Eat light meals such as soups (chicken and noodles, vegetable, chicken and wild rice).  Do not eat foods that you are allergic to.  Taking an antihistamine like Zyrtec can help against postnasal drainage, sinus congestion.  You can take this together with pseudoephedrine (Sudafed) at a dose of 30-60 mg 3 times a day or twice daily as needed for the same kind of nasal drip, congestion.  Use the cough medications as necessary.

## 2021-04-13 NOTE — ED Provider Notes (Signed)
[facility]   MRN: [id number] DOB: 1970-09-24  Subjective:   Stephen Jensen is a 50 y.o. male presenting for 5-day history of acute onset sneezing, coughing, body aches, sinus congestion.  Patient reports that he was concerned his symptoms were from the flu as he had one exposure at work.  He has been using TheraFlu and felt better.  Wanted to make sure he got evaluated today.  No chest pain, shortness of breath, wheezing, headaches.  Patient is not a smoker.  No history of respiratory disorders.  No current facility-administered medications for this encounter.  Current Outpatient Medications:    aspirin EC 325 MG tablet, Take 1 tablet (325 mg total) by mouth 2 (two) times daily., Disp: 60 tablet, Rfl: 1   docusate sodium (COLACE) 100 MG capsule, Take 1 capsule (100 mg total) by mouth 2 (two) times daily as needed for mild constipation., Disp: 30 capsule, Rfl: 1   HYDROcodone-acetaminophen (NORCO/VICODIN) 5-325 MG tablet, Take 1 tablet by mouth every 6 (six) hours as needed for severe pain., Disp: 15 tablet, Rfl: 0   ibuprofen (ADVIL,MOTRIN) 800 MG tablet, Take 1 tablet (800 mg total) by mouth 3 (three) times daily with meals as needed., Disp: 90 tablet, Rfl: 1   oxyCODONE-acetaminophen (PERCOCET) 5-325 MG tablet, Take 1-2 tablets by mouth every 4 (four) hours as needed for severe pain., Disp: 40 tablet, Rfl: 0   No Known Allergies  Past Medical History:  Diagnosis Date   GERD (gastroesophageal reflux disease)    occasional   Heart murmur    hx of   Sleep apnea    Does not wear cpap     Past Surgical History:  Procedure Laterality Date   nickel removed     was swallowed   QUADRICEPS TENDON REPAIR Left 11/17/2016   Procedure: REPAIR QUADRICEP TENDON;  Surgeon: Jene Every, MD;  Location: [facility];  Service: Orthopedics;  Laterality: Left;  90 mins for both procedures   REPAIR OF RUPTURED PATELLA LIGAMENT Left 11/17/2016   Procedure: REPAIR OF RUPTURED  PATELLA LIGAMENT;  Surgeon: Jene Every, MD;  Location: [facility];  Service: Orthopedics;  Laterality: Left;    History reviewed. No pertinent family history.  Social History   Tobacco Use   Smoking status: Never   Smokeless tobacco: Never  Substance Use Topics   Alcohol use: No   Drug use: No    ROS   Objective:   Vitals: BP 132/80 (BP Location: Right Arm)   Pulse 75   Temp 98.2 F (36.8 C) (Oral)   Resp 17   Physical Exam Constitutional:      General: He is not in acute distress.    Appearance: Normal appearance. He is well-developed and normal weight. He is not ill-appearing, toxic-appearing or diaphoretic.  HENT:     Head: Normocephalic and atraumatic.     Right Ear: Tympanic membrane, ear canal and external ear normal. There is no impacted cerumen.     Left Ear: Tympanic membrane, ear canal and external ear normal. There is no impacted cerumen.     Nose: Nose normal. No congestion or rhinorrhea.     Mouth/Throat:     Mouth: Mucous membranes are moist.     Pharynx: No oropharyngeal exudate or posterior oropharyngeal erythema.  Eyes:     General: No scleral icterus.       Right eye: No discharge.        Left eye: No discharge.  Extraocular Movements: Extraocular movements intact.     Conjunctiva/sclera: Conjunctivae normal.     Pupils: Pupils are equal, round, and reactive to light.  Cardiovascular:     Rate and Rhythm: Normal rate and regular rhythm.     Heart sounds: Normal heart sounds. No murmur heard.   No friction rub. No gallop.  Pulmonary:     Effort: Pulmonary effort is normal. No respiratory distress.     Breath sounds: Normal breath sounds. No stridor. No wheezing, rhonchi or rales.  Musculoskeletal:     Cervical back: Normal range of motion and neck supple. No rigidity. No muscular tenderness.  Neurological:     General: No focal deficit present.     Mental Status: He is alert and oriented to person, place, and time.  Psychiatric:         Mood and Affect: Mood normal.        Behavior: Behavior normal.        Thought Content: Thought content normal.        Judgment: Judgment normal.    Results for orders placed or performed during the hospital encounter of 04/13/21 (from the past 24 hour(s))  POC Influenza A & B Ag (Urgent Care)     Status: None   Collection Time: 04/13/21  8:20 PM  Result Value Ref Range   INFLUENZA A ANTIGEN, POC NEGATIVE NEGATIVE   INFLUENZA B ANTIGEN, POC NEGATIVE NEGATIVE    Assessment and Plan :   PDMP not reviewed this encounter.  1. Viral URI with cough   2. Sneezing     Deferred imaging given clear cardiopulmonary exam, hemodynamically stable vital signs. Offered symptomatic relief.  Patient declined COVID-19 testing and I am in agreement given the low incidence currently.  Counseled patient on potential for adverse effects with medications prescribed/recommended today, ER and return-to-clinic precautions discussed, patient verbalized understanding.    Wallis Bamberg, [place] 04/14/21 [phone]

## 2022-05-14 ENCOUNTER — Ambulatory Visit (HOSPITAL_COMMUNITY)
Admission: EM | Admit: 2022-05-14 | Discharge: 2022-05-14 | Disposition: A | Discharge status: Home / Self Care | Payer: Federal, State, Local not specified - PPO | Attending: Emergency Medicine | Admitting: Emergency Medicine

## 2022-05-14 ENCOUNTER — Encounter (HOSPITAL_COMMUNITY): Payer: Self-pay | Admitting: Emergency Medicine

## 2022-05-14 DIAGNOSIS — R519 Headache, unspecified: Secondary | ICD-10-CM | POA: Diagnosis not present

## 2022-05-14 DIAGNOSIS — Z1152 Encounter for screening for COVID-19: Secondary | ICD-10-CM | POA: Insufficient documentation

## 2022-05-14 DIAGNOSIS — J029 Acute pharyngitis, unspecified: Secondary | ICD-10-CM | POA: Diagnosis not present

## 2022-05-14 DIAGNOSIS — H109 Unspecified conjunctivitis: Secondary | ICD-10-CM

## 2022-05-14 DIAGNOSIS — R059 Cough, unspecified: Secondary | ICD-10-CM | POA: Diagnosis not present

## 2022-05-14 DIAGNOSIS — J069 Acute upper respiratory infection, unspecified: Secondary | ICD-10-CM | POA: Diagnosis not present

## 2022-05-14 DIAGNOSIS — R0981 Nasal congestion: Secondary | ICD-10-CM | POA: Diagnosis not present

## 2022-05-14 MED ORDER — AMOXICILLIN 500 MG PO CAPS: 500.0000 mg | ORAL_CAPSULE | Freq: Two times a day (BID) | ORAL | 0 refills | Status: AC

## 2022-05-14 MED ORDER — POLYMYXIN B-TRIMETHOPRIM 10000-0.1 UNIT/ML-% OP SOLN: 1.0000 [drp] | OPHTHALMIC | 0 refills | Status: AC

## 2022-05-14 MED ORDER — BENZONATATE 100 MG PO CAPS: 100.0000 mg | ORAL_CAPSULE | Freq: Three times a day (TID) | ORAL | 0 refills | Status: AC | PRN

## 2022-05-14 MED ORDER — PROMETHAZINE-DM 6.25-15 MG/5ML PO SYRP: 5.0000 mL | ORAL_SOLUTION | Freq: Four times a day (QID) | ORAL | 0 refills | Status: AC | PRN

## 2022-05-14 NOTE — ED Provider Notes (Signed)
[facility]    CSN: [id number] Arrival date & time: 05/14/22  1015      History   Chief Complaint Chief Complaint  Patient presents with   Cough   Nasal Congestion    HPI Stephen Jensen is a 51 y.o. male.   Patient presents with nasal congestion, rhinorrhea, sore throat, cough, chest congestion and generalized headaches beginning 3 days ago.  Has begun to experience increased erythema, mild pain and discharge with crusting to the left eye upon awakening this morning.  No known sick contacts.  Tolerating food and liquids.  Has attempted use of TheraFlu which has been minimally effective.  History of a heart murmur.  Non-smoker.  Denies shortness of breath, wheezing, ear pain, fever, chills or body aches, ear or eye pruritus or visual disturbance.    Past Medical History:  Diagnosis Date   GERD (gastroesophageal reflux disease)    occasional   Heart murmur    hx of   Sleep apnea    Does not wear cpap    Patient Active Problem List   Diagnosis Date Noted   Tendon rupture, traumatic, patella 11/17/2016    Past Surgical History:  Procedure Laterality Date   nickel removed     was swallowed   QUADRICEPS TENDON REPAIR Left 11/17/2016   Procedure: REPAIR QUADRICEP TENDON;  Surgeon: Jene Every, MD;  Location: [facility];  Service: Orthopedics;  Laterality: Left;  90 mins for both procedures   REPAIR OF RUPTURED PATELLA LIGAMENT Left 11/17/2016   Procedure: REPAIR OF RUPTURED PATELLA LIGAMENT;  Surgeon: Jene Every, MD;  Location: [facility];  Service: Orthopedics;  Laterality: Left;       Home Medications    Prior to Admission medications   Medication Sig Start Date End Date Taking? Authorizing Provider  aspirin EC 325 MG tablet Take 1 tablet (325 mg total) by mouth 2 (two) times daily. 11/17/16   Jene Every, MD  benzonatate (TESSALON) 100 MG capsule Take 1-2 capsules (100-200 mg total) by mouth 3 (three) times daily as needed for cough. 04/13/21   Wallis Bamberg, PA-C  cetirizine (ZYRTEC ALLERGY) 10 MG tablet Take 1 tablet (10 mg total) by mouth daily. 04/13/21   Wallis Bamberg, PA-C  docusate sodium (COLACE) 100 MG capsule Take 1 capsule (100 mg total) by mouth 2 (two) times daily as needed for mild constipation. 11/17/16   Jene Every, MD  HYDROcodone-acetaminophen (NORCO/VICODIN) 5-325 MG tablet Take 1 tablet by mouth every 6 (six) hours as needed for severe pain. 11/11/16   Gerhard Munch, MD  ibuprofen (ADVIL,MOTRIN) 800 MG tablet Take 1 tablet (800 mg total) by mouth 3 (three) times daily with meals as needed. 11/17/16   Jene Every, MD  oxyCODONE-acetaminophen (PERCOCET) 5-325 MG tablet Take 1-2 tablets by mouth every 4 (four) hours as needed for severe pain. 11/17/16   Jene Every, MD  promethazine-dextromethorphan (PROMETHAZINE-DM) 6.25-15 MG/5ML syrup Take 5 mLs by mouth at bedtime as needed for cough. 04/13/21   Wallis Bamberg, PA-C  pseudoephedrine (SUDAFED) 60 MG tablet Take 1 tablet (60 mg total) by mouth every 8 (eight) hours as needed for congestion. 04/13/21   Wallis Bamberg, PA-C    Family History No family history on file.  Social History Social History   Tobacco Use   Smoking status: Never   Smokeless tobacco: Never  Substance Use Topics   Alcohol use: No   Drug use: No     Allergies   Patient has no known  allergies.   Review of Systems Review of Systems  Constitutional: Negative.   HENT:  Positive for congestion, rhinorrhea and sore throat. Negative for dental problem, drooling, ear discharge, ear pain, facial swelling, hearing loss, mouth sores, nosebleeds, postnasal drip, sinus pressure, sinus pain, sneezing, tinnitus, trouble swallowing and voice change.   Eyes:  Positive for pain, discharge and redness. Negative for photophobia, itching and visual disturbance.  Respiratory:  Positive for cough. Negative for apnea, choking, chest tightness, shortness of breath, wheezing and stridor.   Cardiovascular: Negative.    Gastrointestinal: Negative.   Skin: Negative.   Neurological:  Positive for headaches. Negative for dizziness, tremors, seizures, syncope, facial asymmetry, speech difficulty, weakness, light-headedness and numbness.     Physical Exam Triage Vital Signs ED Triage Vitals  Enc Vitals Group     BP 05/14/22 1139 (!) 157/97     Pulse Rate 05/14/22 1139 100     Resp 05/14/22 1139 16     Temp 05/14/22 1139 98.4 F (36.9 C)     Temp Source 05/14/22 1139 Oral     SpO2 05/14/22 1139 98 %     Weight --      Height --      Head Circumference --      Peak Flow --      Pain Score 05/14/22 1137 4     Pain Loc --      Pain Edu? --      Excl. in GC? --    No data found.  Updated Vital Signs BP (!) 157/97 (BP Location: Left Arm)   Pulse 100   Temp 98.4 F (36.9 C) (Oral)   Resp 16   SpO2 98%   Visual Acuity Right Eye Distance:   Left Eye Distance:   Bilateral Distance:    Right Eye Near:   Left Eye Near:    Bilateral Near:     Physical Exam Constitutional:      Appearance: Normal appearance.  HENT:     Right Ear: Tympanic membrane, ear canal and external ear normal.     Left Ear: Tympanic membrane, ear canal and external ear normal.     Nose: Congestion and rhinorrhea present.     Mouth/Throat:     Mouth: Mucous membranes are moist.     Pharynx: Posterior oropharyngeal erythema present.  Eyes:     Comments: Significant erythema present to the left conjunctiva with mild erythema present to the right conjunctiva, no drainage noted, vision is grossly intact, extraocular movements intact  Cardiovascular:     Rate and Rhythm: Normal rate and regular rhythm.     Pulses: Normal pulses.     Heart sounds: Normal heart sounds.  Pulmonary:     Effort: Pulmonary effort is normal.     Breath sounds: Normal breath sounds.  Skin:    General: Skin is warm and dry.  Neurological:     Mental Status: He is alert and oriented to person, place, and time. Mental status is at baseline.   Psychiatric:        Mood and Affect: Mood normal.        Behavior: Behavior normal.      UC Treatments / Results  Labs (all labs ordered are listed, but only abnormal results are displayed) Labs Reviewed - No data to display  EKG   Radiology No results found.  Procedures Procedures (including critical care time)  Medications Ordered in UC Medications - No data to display  Initial Impression /  Assessment and Plan / UC Course  I have reviewed the triage vital signs and the nursing notes.  Pertinent labs & imaging results that were available during my care of the patient were reviewed by me and considered in my medical decision making (see chart for details).  Viral URI with cough, bacterial conjunctivitis of left eye  Patient is in no signs of distress nor toxic appearing.  Vital signs are stable.  Low suspicion for pneumonia, pneumothorax or bronchitis and therefore will defer imaging.  Presentation of eyes consistent with conjunctivitis, well could be a viral cause due to accompanying symptoms will provide coverage for bacteria, Polytrim prescribed and discussed administration, advised against eye touching or rubbing to prevent contamination and spread  COVID test is pending, reviewed quarantine guidelines per CDC recommendations as a young healthy adult does not qualify for antiviral treatment  Prescribed Tessalon and Promethazine DM, watchful wait antibiotic placed at pharmacy if no improvement seen by day 7 of illness, amoxicillin prescribed   May use additional over-the-counter medications as needed for supportive care.  May follow-up with urgent care as needed if symptoms persist or worsen.  Note given.    Final Clinical Impressions(s) / UC Diagnoses   Final diagnoses:  None   Discharge Instructions   None    ED Prescriptions   None    PDMP not reviewed this encounter.   Valinda Hoar, NP 05/14/22 1205

## 2022-05-14 NOTE — Discharge Instructions (Signed)
Your symptoms today are most likely being caused by a virus and should steadily improve in time it can take up to 7 to 10 days before you truly start to see a turnaround however things will get better if you have not seen any improvement in your symptoms by Wednesday, May 19, 2022 you may pick up amoxicillin from pharmacy and begin use to provide coverage for bacteria  In the meantime you may use Tessalon pill every 8 hours to help calm your coughing  You may use cough syrup every 6 hours for additional comfort, be mindful this will make you drowsy  For your eyes as there is drainage and crusting will provide coverage for bacteria today, Place 1 drop of Polytrim into the left eye every 4 hours for 7 days, may use in right eye prophylactically or begin use in right eye as symptoms begin  Avoid eye touching or rubbing to prevent further irritation and spread    You can take Tylenol and/or Ibuprofen as needed for fever reduction and pain relief.   For cough: honey 1/2 to 1 teaspoon (you can dilute the honey in water or another fluid).  You can also use guaifenesin and dextromethorphan for cough. You can use a humidifier for chest congestion and cough.  If you don't have a humidifier, you can sit in the bathroom with the hot shower running.      For sore throat: try warm salt water gargles, cepacol lozenges, throat spray, warm tea or water with lemon/honey, popsicles or ice, or OTC cold relief medicine for throat discomfort.   For congestion: take a daily anti-histamine like Zyrtec, Claritin, and a oral decongestant, such as pseudoephedrine.  You can also use Flonase 1-2 sprays in each nostril daily.   It is important to stay hydrated: drink plenty of fluids (water, gatorade/powerade/pedialyte, juices, or teas) to keep your throat moisturized and help further relieve irritation/discomfort.

## 2022-05-14 NOTE — ED Triage Notes (Signed)
For 3 days cough, congestion, sore throat. Taking Theraflu. Reports today woke up with left redness and crusted over.

## 2022-05-15 LAB — SARS CORONAVIRUS 2 (TAT 6-24 HRS): SARS Coronavirus 2: NEGATIVE

## 2022-05-19 ENCOUNTER — Ambulatory Visit (HOSPITAL_COMMUNITY)
Admission: EM | Admit: 2022-05-19 | Discharge: 2022-05-19 | Disposition: A | Discharge status: Home / Self Care | Payer: Federal, State, Local not specified - PPO | Attending: Sports Medicine | Admitting: Sports Medicine

## 2022-05-19 ENCOUNTER — Encounter (HOSPITAL_COMMUNITY): Payer: Self-pay | Admitting: Emergency Medicine

## 2022-05-19 ENCOUNTER — Ambulatory Visit (INDEPENDENT_AMBULATORY_CARE_PROVIDER_SITE_OTHER): Payer: Federal, State, Local not specified - PPO

## 2022-05-19 DIAGNOSIS — J209 Acute bronchitis, unspecified: Secondary | ICD-10-CM

## 2022-05-19 DIAGNOSIS — R059 Cough, unspecified: Secondary | ICD-10-CM

## 2022-05-19 MED ORDER — METHYLPREDNISOLONE 4 MG PO TBPK: ORAL_TABLET | ORAL | 0 refills | Status: AC

## 2022-05-19 NOTE — Discharge Instructions (Addendum)
Your symptoms today are most likely being caused by a virus and should steadily improve in time it can take up to a few weeks before you truly start to see a turnaround however things will get better  Chest x-ray is negative    You can take Tylenol and/or Ibuprofen as needed for fever reduction and pain relief.   For cough: honey 1/2 to 1 teaspoon (you can dilute the honey in water or another fluid).  You can also use guaifenesin and dextromethorphan for cough. You can use a humidifier for chest congestion and cough.  If you don't have a humidifier, you can sit in the bathroom with the hot shower running.      For sore throat: try warm salt water gargles, cepacol lozenges, throat spray, warm tea or water with lemon/honey, popsicles or ice, or OTC cold relief medicine for throat discomfort.   For congestion: take a daily anti-histamine like Zyrtec, Claritin, and a oral decongestant, such as pseudoephedrine.  You can also use Flonase 1-2 sprays in each nostril daily.   It is important to stay hydrated: drink plenty of fluids (water, gatorade/powerade/pedialyte, juices, or teas) to keep your throat moisturized and help further relieve irritation/discomfort.

## 2022-05-19 NOTE — ED Notes (Signed)
This RN went to Discharge patient. Pt requesting chest xray and RSV testing. Informed pt that I would go ask a provider as the one that saw him has already left for the day.  Christian Mate, NP aware of pt's requesting. Chest xray order will be placed. Informed due to patient's ago RSV is contraindicated.  Made patient aware that chest xray is ordered but RSV is not necessary.

## 2022-05-19 NOTE — ED Provider Notes (Signed)
[place]   PT:[id number] 05/19/22 Arrival Time: [id number]  ASSESSMENT & PLAN:  1. Acute bronchitis, unspecified organism    -History and exam consistent with acute bronchitis.  Counseled on expected duration of symptoms, up to several weeks.  Recommend continued use of over-the-counter medicines and prescription cough medicines from last visit.  Will prescribe a short steroid taper to calm down inflammation. Informed of potential side effects.  Work note given.  All questions answered and he agrees to plan.  Paperwork given to see Dr. Owens Shark at [place] to establish care  Meds ordered this encounter  Medications   methylPREDNISolone (MEDROL DOSEPAK) 4 MG TBPK tablet    Sig: Take as directed on packaging    Dispense:  21 tablet    Refill:  0     Discharge Instructions      Your symptoms today are most likely being caused by a virus and should steadily improve in time it can take up to a few weeks before you truly start to see a turnaround however things will get better    You can take Tylenol and/or Ibuprofen as needed for fever reduction and pain relief.   For cough: honey 1/2 to 1 teaspoon (you can dilute the honey in water or another fluid).  You can also use guaifenesin and dextromethorphan for cough. You can use a humidifier for chest congestion and cough.  If you don't have a humidifier, you can sit in the bathroom with the hot shower running.      For sore throat: try warm salt water gargles, cepacol lozenges, throat spray, warm tea or water with lemon/honey, popsicles or ice, or OTC cold relief medicine for throat discomfort.   For congestion: take a daily anti-histamine like Zyrtec, Claritin, and a oral decongestant, such as pseudoephedrine.  You can also use Flonase 1-2 sprays in each nostril daily.   It is important to stay hydrated: drink plenty of fluids (water, gatorade/powerade/pedialyte, juices, or teas) to keep your throat moisturized and help  further relieve irritation/discomfort.       Follow-up Information     Schedule an appointment as soon as possible for a visit  with Orvis Brill, DO.   Specialty: Family Medicine Why: For full physical/check up Contact information: [place] [address] [phone]                  Reviewed expectations re: course of current medical issues. Questions answered. Outlined signs and symptoms indicating need for more acute intervention. Patient verbalized understanding. After Visit Summary given.   SUBJECTIVE:  Pleasant 51 year old male comes to urgent care to be evaluated for cough and sore throat.  Symptoms been present for over a week.  He was here on Friday for same, along with conjunctivitis.  He was prescribed eyedrops and the conjunctivitis is now better.  Unfortunately his cough has persisted.  He has been taking the 2 cough medicines as prescribed.  He also took amoxicillin.  He is flu and COVID test were negative last week.  No new symptoms other than persistent cough.  His cough is nonproductive.  He is now starting to lose his voice.  The cough is especially bothersome at night.  He just comes back today to get rechecked.  She has not seen a doctor in over 15 years.  He has never had a colonoscopy.  He would like referral to primary care to get established.  No LMP for male patient. Past Surgical  History:  Procedure Laterality Date   nickel removed     was swallowed   QUADRICEPS TENDON REPAIR Left 11/17/2016   Procedure: REPAIR QUADRICEP TENDON;  Surgeon: Jene Every, MD;  Location: [facility];  Service: Orthopedics;  Laterality: Left;  90 mins for both procedures   REPAIR OF RUPTURED PATELLA LIGAMENT Left 11/17/2016   Procedure: REPAIR OF RUPTURED PATELLA LIGAMENT;  Surgeon: Jene Every, MD;  Location: [facility];  Service: Orthopedics;  Laterality: Left;     OBJECTIVE:  Vitals:   05/19/22 1326  BP: (!) 143/91  Pulse: 92  Resp: 17   Temp: 98.2 F (36.8 C)  TempSrc: Oral  SpO2: 95%     Physical Exam Vitals and nursing note reviewed.  Constitutional:      Appearance: He is not diaphoretic.  HENT:     Right Ear: Tympanic membrane normal.     Left Ear: Tympanic membrane normal.     Mouth/Throat:     Pharynx: Posterior oropharyngeal erythema present.  Cardiovascular:     Rate and Rhythm: Normal rate.  Pulmonary:     Effort: Pulmonary effort is normal.     Breath sounds: Normal breath sounds. No wheezing.  Abdominal:     Palpations: Abdomen is soft.  Musculoskeletal:        General: Normal range of motion.  Lymphadenopathy:     Cervical: Cervical adenopathy present.  Skin:    General: Skin is warm.  Neurological:     Mental Status: He is alert.      Labs: Results for orders placed or performed during the hospital encounter of 05/14/22  SARS CORONAVIRUS 2 (TAT 6-24 HRS) Anterior Nasal Swab   Specimen: Anterior Nasal Swab  Result Value Ref Range   SARS Coronavirus 2 NEGATIVE NEGATIVE   Labs Reviewed - No data to display  Imaging: No results found.   No Known Allergies                                             Past Medical History:  Diagnosis Date   GERD (gastroesophageal reflux disease)    occasional   Heart murmur    hx of   Sleep apnea    Does not wear cpap    Social History   Socioeconomic History   Marital status: Married    Spouse name: Not on file   Number of children: Not on file   Years of education: Not on file   Highest education level: Not on file  Occupational History   Not on file  Tobacco Use   Smoking status: Never   Smokeless tobacco: Never  Substance and Sexual Activity   Alcohol use: No   Drug use: No   Sexual activity: Yes  Other Topics Concern   Not on file  Social History Narrative   Not on file   Social Determinants of Health   Financial Resource Strain: Not on file  Food Insecurity: Not on file  Transportation Needs: Not on file  Physical  Activity: Not on file  Stress: Not on file  Social Connections: Not on file  Intimate Partner Violence: Not on file    No family history on file.    Kaislee Chao, Baldemar Friday, MD 05/19/22 1406

## 2022-05-19 NOTE — ED Triage Notes (Signed)
Seen on Friday and covid and flu both negative. Still having cough voice now hoarse and causing a headache. Reports that taking two types of cough medications that were prescribed.  Pt has already been taking the antibiotic that wasn't supposed to start until today on accident.

## 2022-05-19 NOTE — ED Provider Notes (Signed)
On discharge, patient notified nursing staff that he would like a chest x-ray, obliged, chest x-ray negative, discharged with current treatment if no changes by provider that he was evaluated by today   Valinda Hoar, NP 05/19/22 1448

## 2023-08-15 DIAGNOSIS — Z13220 Encounter for screening for lipoid disorders: Secondary | ICD-10-CM | POA: Diagnosis not present

## 2023-08-15 DIAGNOSIS — Z1211 Encounter for screening for malignant neoplasm of colon: Secondary | ICD-10-CM | POA: Diagnosis not present

## 2023-08-15 DIAGNOSIS — Z0000 Encounter for general adult medical examination without abnormal findings: Secondary | ICD-10-CM | POA: Diagnosis not present

## 2023-08-15 DIAGNOSIS — Z202 Contact with and (suspected) exposure to infections with a predominantly sexual mode of transmission: Secondary | ICD-10-CM | POA: Diagnosis not present

## 2023-08-15 DIAGNOSIS — Z125 Encounter for screening for malignant neoplasm of prostate: Secondary | ICD-10-CM | POA: Diagnosis not present

## 2023-09-15 DIAGNOSIS — Z1211 Encounter for screening for malignant neoplasm of colon: Secondary | ICD-10-CM | POA: Diagnosis not present

## 2023-10-10 ENCOUNTER — Other Ambulatory Visit (HOSPITAL_COMMUNITY): Payer: Self-pay | Admitting: Family Medicine

## 2023-10-10 DIAGNOSIS — E785 Hyperlipidemia, unspecified: Secondary | ICD-10-CM

## 2023-10-18 DIAGNOSIS — R0683 Snoring: Secondary | ICD-10-CM | POA: Diagnosis not present

## 2023-10-20 ENCOUNTER — Encounter (HOSPITAL_BASED_OUTPATIENT_CLINIC_OR_DEPARTMENT_OTHER): Payer: Self-pay

## 2023-10-20 ENCOUNTER — Other Ambulatory Visit (HOSPITAL_BASED_OUTPATIENT_CLINIC_OR_DEPARTMENT_OTHER)

## 2023-10-24 DIAGNOSIS — Y9367 Activity, basketball: Secondary | ICD-10-CM | POA: Diagnosis not present

## 2023-10-24 DIAGNOSIS — M19071 Primary osteoarthritis, right ankle and foot: Secondary | ICD-10-CM | POA: Diagnosis not present

## 2023-10-24 DIAGNOSIS — Y9302 Activity, running: Secondary | ICD-10-CM | POA: Diagnosis not present

## 2023-10-24 DIAGNOSIS — X58XXXA Exposure to other specified factors, initial encounter: Secondary | ICD-10-CM | POA: Diagnosis not present

## 2023-10-24 DIAGNOSIS — G4733 Obstructive sleep apnea (adult) (pediatric): Secondary | ICD-10-CM | POA: Diagnosis not present

## 2023-10-24 DIAGNOSIS — M25571 Pain in right ankle and joints of right foot: Secondary | ICD-10-CM | POA: Diagnosis not present

## 2023-10-24 DIAGNOSIS — S86011A Strain of right Achilles tendon, initial encounter: Secondary | ICD-10-CM | POA: Diagnosis not present

## 2023-10-24 DIAGNOSIS — S86001A Unspecified injury of right Achilles tendon, initial encounter: Secondary | ICD-10-CM | POA: Diagnosis not present

## 2023-10-25 DIAGNOSIS — S86011A Strain of right Achilles tendon, initial encounter: Secondary | ICD-10-CM | POA: Diagnosis not present

## 2023-10-25 DIAGNOSIS — S86001A Unspecified injury of right Achilles tendon, initial encounter: Secondary | ICD-10-CM | POA: Diagnosis not present

## 2023-10-27 DIAGNOSIS — S86011A Strain of right Achilles tendon, initial encounter: Secondary | ICD-10-CM | POA: Diagnosis not present

## 2023-11-02 DIAGNOSIS — X58XXXA Exposure to other specified factors, initial encounter: Secondary | ICD-10-CM | POA: Diagnosis not present

## 2023-11-02 DIAGNOSIS — G8918 Other acute postprocedural pain: Secondary | ICD-10-CM | POA: Diagnosis not present

## 2023-11-02 DIAGNOSIS — S86011A Strain of right Achilles tendon, initial encounter: Secondary | ICD-10-CM | POA: Diagnosis not present

## 2023-11-02 DIAGNOSIS — G4733 Obstructive sleep apnea (adult) (pediatric): Secondary | ICD-10-CM | POA: Diagnosis not present

## 2023-11-10 DIAGNOSIS — R0681 Apnea, not elsewhere classified: Secondary | ICD-10-CM | POA: Diagnosis not present

## 2023-11-24 DIAGNOSIS — S86011D Strain of right Achilles tendon, subsequent encounter: Secondary | ICD-10-CM | POA: Diagnosis not present

## 2023-12-08 DIAGNOSIS — Z7409 Other reduced mobility: Secondary | ICD-10-CM | POA: Diagnosis not present

## 2023-12-08 DIAGNOSIS — Z9889 Other specified postprocedural states: Secondary | ICD-10-CM | POA: Diagnosis not present

## 2023-12-08 DIAGNOSIS — R29898 Other symptoms and signs involving the musculoskeletal system: Secondary | ICD-10-CM | POA: Diagnosis not present

## 2023-12-08 DIAGNOSIS — M25671 Stiffness of right ankle, not elsewhere classified: Secondary | ICD-10-CM | POA: Diagnosis not present

## 2023-12-15 DIAGNOSIS — S86011A Strain of right Achilles tendon, initial encounter: Secondary | ICD-10-CM | POA: Diagnosis not present

## 2023-12-20 DIAGNOSIS — Z9889 Other specified postprocedural states: Secondary | ICD-10-CM | POA: Diagnosis not present

## 2023-12-20 DIAGNOSIS — M25671 Stiffness of right ankle, not elsewhere classified: Secondary | ICD-10-CM | POA: Diagnosis not present

## 2023-12-20 DIAGNOSIS — R29898 Other symptoms and signs involving the musculoskeletal system: Secondary | ICD-10-CM | POA: Diagnosis not present

## 2023-12-20 DIAGNOSIS — Z7409 Other reduced mobility: Secondary | ICD-10-CM | POA: Diagnosis not present

## 2024-01-04 DIAGNOSIS — R29898 Other symptoms and signs involving the musculoskeletal system: Secondary | ICD-10-CM | POA: Diagnosis not present

## 2024-01-04 DIAGNOSIS — Z7409 Other reduced mobility: Secondary | ICD-10-CM | POA: Diagnosis not present

## 2024-01-04 DIAGNOSIS — M25671 Stiffness of right ankle, not elsewhere classified: Secondary | ICD-10-CM | POA: Diagnosis not present

## 2024-01-04 DIAGNOSIS — Z9889 Other specified postprocedural states: Secondary | ICD-10-CM | POA: Diagnosis not present

## 2024-01-18 DIAGNOSIS — Z7409 Other reduced mobility: Secondary | ICD-10-CM | POA: Diagnosis not present

## 2024-01-18 DIAGNOSIS — Z9889 Other specified postprocedural states: Secondary | ICD-10-CM | POA: Diagnosis not present

## 2024-01-18 DIAGNOSIS — M25671 Stiffness of right ankle, not elsewhere classified: Secondary | ICD-10-CM | POA: Diagnosis not present

## 2024-01-18 DIAGNOSIS — R29898 Other symptoms and signs involving the musculoskeletal system: Secondary | ICD-10-CM | POA: Diagnosis not present

## 2024-01-26 DIAGNOSIS — S86011A Strain of right Achilles tendon, initial encounter: Secondary | ICD-10-CM | POA: Diagnosis not present

## 2024-01-30 DIAGNOSIS — Z7409 Other reduced mobility: Secondary | ICD-10-CM | POA: Diagnosis not present

## 2024-01-30 DIAGNOSIS — Z9889 Other specified postprocedural states: Secondary | ICD-10-CM | POA: Diagnosis not present

## 2024-01-30 DIAGNOSIS — M25671 Stiffness of right ankle, not elsewhere classified: Secondary | ICD-10-CM | POA: Diagnosis not present

## 2024-01-30 DIAGNOSIS — R29898 Other symptoms and signs involving the musculoskeletal system: Secondary | ICD-10-CM | POA: Diagnosis not present
